# Patient Record
Sex: Female | Born: 1940 | Race: White | Hispanic: No | Marital: Single | Smoking: Never smoker
Health system: Southern US, Community
[De-identification: ages and names within clinical notes are randomized; demographics above are authoritative.]

## PROBLEM LIST (undated history)

## (undated) DIAGNOSIS — Z8619 Personal history of other infectious and parasitic diseases: Secondary | ICD-10-CM

## (undated) DIAGNOSIS — M5030 Other cervical disc degeneration, unspecified cervical region: Secondary | ICD-10-CM

## (undated) DIAGNOSIS — Z860100 Personal history of colon polyps, unspecified: Secondary | ICD-10-CM

## (undated) DIAGNOSIS — J309 Allergic rhinitis, unspecified: Secondary | ICD-10-CM

## (undated) DIAGNOSIS — G459 Transient cerebral ischemic attack, unspecified: Secondary | ICD-10-CM

## (undated) DIAGNOSIS — I639 Cerebral infarction, unspecified: Secondary | ICD-10-CM

## (undated) HISTORY — PX: TUBAL LIGATION: SHX77

## (undated) HISTORY — PX: APPENDECTOMY: SHX54

## (undated) HISTORY — PX: EYE SURGERY: SHX253

---

## 2003-10-11 ENCOUNTER — Ambulatory Visit: Payer: Self-pay | Admitting: Unknown Physician Specialty

## 2004-09-09 ENCOUNTER — Ambulatory Visit: Payer: Self-pay | Admitting: Obstetrics and Gynecology

## 2005-09-28 ENCOUNTER — Ambulatory Visit: Payer: Self-pay | Admitting: Obstetrics and Gynecology

## 2006-10-04 ENCOUNTER — Ambulatory Visit: Payer: Self-pay | Admitting: Obstetrics and Gynecology

## 2007-10-06 ENCOUNTER — Ambulatory Visit: Payer: Self-pay | Admitting: Obstetrics and Gynecology

## 2008-04-16 ENCOUNTER — Emergency Department: Payer: Self-pay | Admitting: Emergency Medicine

## 2008-04-22 ENCOUNTER — Emergency Department: Payer: Self-pay | Admitting: Emergency Medicine

## 2008-10-11 ENCOUNTER — Ambulatory Visit: Payer: Self-pay | Admitting: Obstetrics and Gynecology

## 2009-10-28 ENCOUNTER — Ambulatory Visit: Payer: Self-pay | Admitting: Obstetrics and Gynecology

## 2010-12-17 ENCOUNTER — Ambulatory Visit: Payer: Self-pay | Admitting: Obstetrics and Gynecology

## 2011-12-21 ENCOUNTER — Ambulatory Visit: Payer: Self-pay | Admitting: Obstetrics and Gynecology

## 2012-08-15 ENCOUNTER — Ambulatory Visit: Payer: Self-pay | Admitting: Ophthalmology

## 2012-12-21 ENCOUNTER — Ambulatory Visit: Payer: Self-pay | Admitting: Obstetrics and Gynecology

## 2015-07-16 ENCOUNTER — Encounter: Payer: Self-pay | Admitting: Emergency Medicine

## 2015-07-16 ENCOUNTER — Observation Stay: Payer: Medicare Other

## 2015-07-16 ENCOUNTER — Observation Stay
Admission: EM | Admit: 2015-07-16 | Discharge: 2015-07-17 | Disposition: A | Discharge status: Home / Self Care | Payer: BLUE CROSS/BLUE SHIELD | Attending: Internal Medicine | Admitting: Internal Medicine

## 2015-07-16 ENCOUNTER — Emergency Department: Payer: BLUE CROSS/BLUE SHIELD

## 2015-07-16 ENCOUNTER — Observation Stay: Payer: BLUE CROSS/BLUE SHIELD

## 2015-07-16 ENCOUNTER — Inpatient Hospital Stay
Admit: 2015-07-16 | Discharge: 2015-07-16 | Disposition: A | Discharge status: Home / Self Care | Payer: BLUE CROSS/BLUE SHIELD | Attending: Internal Medicine | Admitting: Internal Medicine

## 2015-07-16 ENCOUNTER — Ambulatory Visit (INDEPENDENT_AMBULATORY_CARE_PROVIDER_SITE_OTHER)
Admission: EM | Admit: 2015-07-16 | Discharge: 2015-07-16 | Disposition: A | Discharge status: Other Acute Inpatient Hospital | Payer: BLUE CROSS/BLUE SHIELD | Source: Home / Self Care | Attending: Family Medicine | Admitting: Family Medicine

## 2015-07-16 DIAGNOSIS — Z79899 Other long term (current) drug therapy: Secondary | ICD-10-CM | POA: Diagnosis not present

## 2015-07-16 DIAGNOSIS — I10 Essential (primary) hypertension: Secondary | ICD-10-CM | POA: Diagnosis not present

## 2015-07-16 DIAGNOSIS — J309 Allergic rhinitis, unspecified: Secondary | ICD-10-CM | POA: Diagnosis not present

## 2015-07-16 DIAGNOSIS — M4802 Spinal stenosis, cervical region: Secondary | ICD-10-CM

## 2015-07-16 DIAGNOSIS — G459 Transient cerebral ischemic attack, unspecified: Secondary | ICD-10-CM

## 2015-07-16 DIAGNOSIS — R200 Anesthesia of skin: Secondary | ICD-10-CM | POA: Diagnosis present

## 2015-07-16 DIAGNOSIS — Z8249 Family history of ischemic heart disease and other diseases of the circulatory system: Secondary | ICD-10-CM | POA: Diagnosis not present

## 2015-07-16 DIAGNOSIS — E871 Hypo-osmolality and hyponatremia: Secondary | ICD-10-CM

## 2015-07-16 DIAGNOSIS — M50323 Other cervical disc degeneration at C6-C7 level: Secondary | ICD-10-CM | POA: Insufficient documentation

## 2015-07-16 DIAGNOSIS — D1809 Hemangioma of other sites: Secondary | ICD-10-CM | POA: Diagnosis not present

## 2015-07-16 DIAGNOSIS — Z800 Family history of malignant neoplasm of digestive organs: Secondary | ICD-10-CM | POA: Insufficient documentation

## 2015-07-16 DIAGNOSIS — I700 Atherosclerosis of aorta: Secondary | ICD-10-CM | POA: Diagnosis not present

## 2015-07-16 DIAGNOSIS — M5124 Other intervertebral disc displacement, thoracic region: Secondary | ICD-10-CM | POA: Insufficient documentation

## 2015-07-16 DIAGNOSIS — Z791 Long term (current) use of non-steroidal anti-inflammatories (NSAID): Secondary | ICD-10-CM | POA: Insufficient documentation

## 2015-07-16 DIAGNOSIS — R531 Weakness: Secondary | ICD-10-CM

## 2015-07-16 DIAGNOSIS — M5030 Other cervical disc degeneration, unspecified cervical region: Secondary | ICD-10-CM

## 2015-07-16 DIAGNOSIS — M2578 Osteophyte, vertebrae: Secondary | ICD-10-CM | POA: Diagnosis not present

## 2015-07-16 HISTORY — DX: Allergic rhinitis, unspecified: J30.9

## 2015-07-16 LAB — COMPREHENSIVE METABOLIC PANEL
ALBUMIN: 4.7 g/dL (ref 3.5–5.0)
ALT: 16 U/L (ref 14–54)
AST: 26 U/L (ref 15–41)
Alkaline Phosphatase: 84 U/L (ref 38–126)
Anion gap: 9 (ref 5–15)
BUN: 8 mg/dL (ref 6–20)
CHLORIDE: 102 mmol/L (ref 101–111)
CO2: 23 mmol/L (ref 22–32)
CREATININE: 0.84 mg/dL (ref 0.44–1.00)
Calcium: 9 mg/dL (ref 8.9–10.3)
GFR calc Af Amer: >60 mL/min (ref >60)
GLUCOSE: 106 mg/dL — AB (ref 65–99)
POTASSIUM: 3.9 mmol/L (ref 3.5–5.1)
Sodium: 134 mmol/L — ABNORMAL LOW (ref 135–145)
Total Bilirubin: 0.6 mg/dL (ref 0.3–1.2)
Total Protein: 7.5 g/dL (ref 6.5–8.1)

## 2015-07-16 LAB — URINALYSIS COMPLETE WITH MICROSCOPIC (ARMC ONLY)
BILIRUBIN URINE: NEGATIVE
Bacteria, UA: NONE SEEN
GLUCOSE, UA: NEGATIVE mg/dL
KETONES UR: NEGATIVE mg/dL
Leukocytes, UA: NEGATIVE
Nitrite: NEGATIVE
Protein, ur: NEGATIVE mg/dL
SPECIFIC GRAVITY, URINE: 1.004 — AB (ref 1.005–1.030)
WBC, UA: NONE SEEN WBC/hpf (ref 0–5)
pH: 7 (ref 5.0–8.0)

## 2015-07-16 LAB — APTT: APTT: 25 s (ref 24–36)

## 2015-07-16 LAB — CBC WITH DIFFERENTIAL/PLATELET
BASOS ABS: 0 10*3/uL (ref 0–0.1)
BASOS PCT: 1 %
EOS PCT: 0 %
Eosinophils Absolute: 0 10*3/uL (ref 0–0.7)
HCT: 37.4 % (ref 35.0–47.0)
Hemoglobin: 12.9 g/dL (ref 12.0–16.0)
LYMPHS PCT: 21 %
Lymphs Abs: 1.7 10*3/uL (ref 1.0–3.6)
MCH: 29.1 pg (ref 26.0–34.0)
MCHC: 34.5 g/dL (ref 32.0–36.0)
MCV: 84.4 fL (ref 80.0–100.0)
MONO ABS: 0.4 10*3/uL (ref 0.2–0.9)
Monocytes Relative: 5 %
NEUTROS ABS: 6.2 10*3/uL (ref 1.4–6.5)
Neutrophils Relative %: 73 %
PLATELETS: 302 10*3/uL (ref 150–440)
RBC: 4.44 MIL/uL (ref 3.80–5.20)
RDW: 13.4 % (ref 11.5–14.5)
WBC: 8.4 10*3/uL (ref 3.6–11.0)

## 2015-07-16 LAB — TROPONIN I

## 2015-07-16 LAB — PROTIME-INR
INR: 0.94
Prothrombin Time: 12.8 seconds (ref 11.4–15.0)

## 2015-07-16 LAB — GLUCOSE, CAPILLARY: GLUCOSE-CAPILLARY: 116 mg/dL — AB (ref 65–99)

## 2015-07-16 MED ORDER — ASPIRIN 81 MG PO CHEW
324.0000 mg | CHEWABLE_TABLET | Freq: Once | ORAL | Status: AC
  Administered 2015-07-16: 324 mg via ORAL
  Filled 2015-07-16: qty 4

## 2015-07-16 MED ORDER — ACETAMINOPHEN 325 MG PO TABS: 650.0000 mg | ORAL_TABLET | Freq: Four times a day (QID) | ORAL | Status: DC | PRN

## 2015-07-16 MED ORDER — ATORVASTATIN CALCIUM 20 MG PO TABS
40.0000 mg | ORAL_TABLET | Freq: Every day | ORAL | Status: DC
  Administered 2015-07-16: 40 mg via ORAL
  Filled 2015-07-16: qty 2

## 2015-07-16 MED ORDER — SODIUM CHLORIDE 0.9% FLUSH
3.0000 mL | Freq: Two times a day (BID) | INTRAVENOUS | Status: DC
  Administered 2015-07-16: 3 mL via INTRAVENOUS

## 2015-07-16 MED ORDER — ACETAMINOPHEN 650 MG RE SUPP: 650.0000 mg | Freq: Four times a day (QID) | RECTAL | Status: DC | PRN

## 2015-07-16 MED ORDER — LORATADINE 10 MG PO TABS
10.0000 mg | ORAL_TABLET | Freq: Every day | ORAL | Status: DC
  Administered 2015-07-17: 10 mg via ORAL
  Filled 2015-07-16: qty 1

## 2015-07-16 MED ORDER — ASPIRIN EC 81 MG PO TBEC
81.0000 mg | DELAYED_RELEASE_TABLET | Freq: Every day | ORAL | Status: DC
  Administered 2015-07-17: 81 mg via ORAL
  Filled 2015-07-16: qty 1

## 2015-07-16 MED ORDER — ENOXAPARIN SODIUM 40 MG/0.4ML ~~LOC~~ SOLN
40.0000 mg | SUBCUTANEOUS | Status: DC
  Administered 2015-07-16: 40 mg via SUBCUTANEOUS
  Filled 2015-07-16: qty 0.4

## 2015-07-16 NOTE — ED Notes (Signed)
Patient states that 7am this morning she started having left sided weakness. Patient states that the episodes come and go and she feels a heavyness on left side. Patient states that she has had some nausea and vision disturbance. Patient states that right now she feels okay.

## 2015-07-16 NOTE — ED Notes (Signed)
Pt to ED via EMS from [facility] c/o left leg numbness and weakness around 0715 this morning pt states resolved around 0800.  Pt states left knee buckled around 6-7 times and felt weak and heavy with tingling, tingling to left arm and corner or left mouth as well.  Pt denies any pain and SOB.  States mild nausea and no vomiting.  Denies hx of stroke or cardiac.  Pt is A&Ox4, speaking in complete and coherent sentences and in NAD at this time.

## 2015-07-16 NOTE — ED Provider Notes (Signed)
[facility] Emergency Department Provider Note   ____________________________________________  Time seen: Approximately 10:57 AM  I have reviewed the triage vital signs and the nursing notes.   HISTORY  Chief Complaint Weakness    HPI Wendy Decker is a 75 y.o. female with no chronic medical problems who presents for evaluation of left leg numbness and weakness this morning between the hours of approximately 7 AM and 9 AM, now resolved, moderate, no modifying factors. Patient reports that several times this morning her left leg buckled when she was trying to walk, she noted numbness and tingling in the left leg and also had some tingling in the left arm of her mouth. She was seen at urgent care and referred to emergency department for further evaluation per she has never had anything like this before. She does have a family history of CVAs and TIAs. Currently she reports her weakness has resolved but "I still don't feel right on the left side of my body". They difficulty breathing, no vomiting, she has had 2 episodes of nonbloody diarrhea today. No fevers or chills. No dysuria.   History reviewed. No pertinent past medical history.  There are no active problems to display for this patient.   Past Surgical History  Procedure Laterality Date  . Appendectomy      No current outpatient prescriptions on file.  Allergies Review of patient's allergies indicates no known allergies.  Family History  Problem Relation Age of Onset  . Pancreatic cancer Father     Social History Social History  Substance Use Topics  . Smoking status: Never Smoker   . Smokeless tobacco: None  . Alcohol Use: No    Review of Systems Constitutional: No fever/chills Eyes: No visual changes. ENT: No sore throat. Cardiovascular: Denies chest pain. Respiratory: Denies shortness of breath. Gastrointestinal: No abdominal pain.  No nausea, no vomiting.  No diarrhea.  No  constipation. Genitourinary: Negative for dysuria. Musculoskeletal: Negative for back pain. Skin: Negative for rash. Neurological: Negative for headaches, positive for focal weakness and numbness.  10-point ROS otherwise negative.  ____________________________________________   PHYSICAL EXAM:  Filed Vitals:   07/16/15 1057 07/16/15 1100  BP: 172/80 161/73  Pulse: 101   Temp: 98.5 F (36.9 C)   TempSrc: Oral   Resp: 14 12  Height: 5\' 1"  (1.549 m)   Weight: 115 lb (52.164 kg)   SpO2: 98%     Constitutional: Alert and oriented. Well appearing and in no acute distress. Eyes: Conjunctivae are normal. PERRL. EOMI. Head: Atraumatic. Nose: No congestion/rhinnorhea. Mouth/Throat: Mucous membranes are moist.  Oropharynx non-erythematous. Neck: No stridor.  Supple without meningismus. Cardiovascular: Normal rate, regular rhythm. Grossly normal heart sounds.  Good peripheral circulation. Respiratory: Normal respiratory effort.  No retractions. Lungs CTAB. Gastrointestinal: Soft and nontender. No distention. No CVA tenderness. Genitourinary: deferred Musculoskeletal: No lower extremity tenderness nor edema.  No joint effusions. Neurologic:  Normal speech and language. No gross focal neurologic deficits are appreciated. No gait instability. 5 out of 5 strength in bilateral upper and lower extremities, sensation intact to light touch throughout, cranial nerves II through XII intact. Skin:  Skin is warm, dry and intact. No rash noted. Psychiatric: Mood and affect are normal. Speech and behavior are normal.  ____________________________________________   LABS (all labs ordered are listed, but only abnormal results are displayed)  Labs Reviewed  COMPREHENSIVE METABOLIC PANEL - Abnormal; Notable for the following:    Sodium 134 (*)    Glucose, Bld  106 (*)    All other components within normal limits  URINALYSIS COMPLETEWITH MICROSCOPIC ([facility] ONLY) - Abnormal; Notable for the  following:    Color, Urine COLORLESS (*)    APPearance CLEAR (*)    Specific Gravity, Urine 1.004 (*)    Hgb urine dipstick 1+ (*)    Squamous Epithelial / LPF 0-5 (*)    All other components within normal limits  CBC WITH DIFFERENTIAL/PLATELET  TROPONIN I  PROTIME-INR  APTT   ____________________________________________  EKG  ED ECG REPORT I, Joanne Gavel, the attending physician, personally viewed and interpreted this ECG.   Date: 07/16/2015  EKG Time: 10:55  Rate: 94  Rhythm: normal sinus rhythm  Axis: normal  Intervals:none  ST&T Change: No acute ST elevation or acute ST depression.  ____________________________________________  RADIOLOGY  CT head IMPRESSION: Age related volume loss with patchy periventricular small vessel disease. No acute infarct evident. No hemorrhage or mass effect.  There are foci of carotid artery calcification. The left lens appears somewhat irregular by CT.   CXR IMPRESSION: Aortic atherosclerosis. No edema or consolidation.   ____________________________________________   PROCEDURES  Procedure(s) performed: None  Procedures  Critical Care performed: No  ____________________________________________   INITIAL IMPRESSION / ASSESSMENT AND PLAN / ED COURSE  Pertinent labs & imaging results that were available during my care of the patient were reviewed by me and considered in my medical decision making (see chart for details).  Wendy Decker is a 75 y.o. female with no chronic medical problems who presents for evaluation of left leg numbness and weakness this morning between the hours of approximately 7 AM and 9 AM, now resolved, moderate, no modifying factors. On exam, she is well-appearing in no acute distress. Her vital signs are stable and she is afebrile. She has an intact neurological examination at this time. Current and NIH stroke scale is 0, not a candidate for tPa. Concern for TIA. CBC CMP unremarkable, negative  troponin and coags are within normal limits. Urinalysis is not consistent with infection. CT head shows no acute intracranial abnormality. Chest x-ray shows atherosclerosis of the aorta but no acute cardiopulmonary abnormality. I have ordered aspirin. Case discussed with hospitalist for admission at 12:25 PM. ____________________________________________   FINAL CLINICAL IMPRESSION(S) / ED DIAGNOSES  Final diagnoses:  Transient cerebral ischemia, unspecified transient cerebral ischemia type      NEW MEDICATIONS STARTED DURING THIS VISIT:  New Prescriptions   No medications on file     Note:  This document was prepared using Dragon voice recognition software and may include unintentional dictation errors.    Joanne Gavel, MD 07/16/15 1226

## 2015-07-16 NOTE — ED Provider Notes (Signed)
[facility]  ____________________________________________  Time seen: Approximately 10:07 AM  I have reviewed the triage vital signs and the nursing notes.   HISTORY  Chief Complaint Weakness  HPI Wendy Decker is a 75 y.o. femalepresents via private vehicle with husband for the complaints of left-sided weakness. Patient reports at 7 AM this morning her left leg suddenly felt weak as if it were going to buckle and give out. Patient denies fall. Patient reports she then had onset of left arm and left hand weakness. Patient states that when she held her cell phone, it felt like the cell phone was extremely heavy. Patient patient reports she then gradually had onset of left neck and left lateral mouth tingling and decreased sensation. Accompanying this she also reports slight blurry vision. Patient reports that she is due for cataract surgery but states that this was different than what her baseline is. Patient reports that she had not yet eaten this morning.  Patient states that currently she is feeling better but still has continued "left side of body not completely normal" and left neck was still decreased sensation. Denies headache. Denies chest pain, shortness of breath, abdominal pain, or pain. Denies recent sickness, recent fevers, recent hospitalizations. Denies any history of same in the past. Denies any recent fall or head trauma. Patient reports that when she felt like her left leg was going to buckle and give out it was not from a pain or feeling like the knee or hip was going to buckle but states that overall weakness.  Patient does also report that she has had 2 episodes of diarrhea this morning as well as her symptoms are worse when actively moving. Patient denies dizziness or room spinning. Reports she does have a history of vertigo years ago and states that this is different.   Patient denies any medical history for herself. Denies any prescription medications. Patient  reports  her mother had TIAs as well as her sister had a CVA with residual deficits. Denies history of hypertension.   History reviewed. No pertinent past medical history.  There are no active problems to display for this patient.   Past Surgical History  Procedure Laterality Date  . Appendectomy      No current outpatient prescriptions on file.  Allergies Review of patient's allergies indicates no known allergies.  Family History  Problem Relation Age of Onset  . Pancreatic cancer Father     Social History Social History  Substance Use Topics  . Smoking status: Never Smoker   . Smokeless tobacco: None  . Alcohol Use: No    Review of Systems Constitutional: No fever/chills Eyes: No visual changes. ENT: No sore throat. Cardiovascular: Denies chest pain. Respiratory: Denies shortness of breath. Gastrointestinal: No abdominal pain.  No nausea, no vomiting.  No diarrhea.  No constipation. Genitourinary: Negative for dysuria. Musculoskeletal: Negative for back pain. Skin: Negative for rash. Neurological: Negative for headaches.  10-point ROS otherwise negative.  ____________________________________________   PHYSICAL EXAM:  VITAL SIGNS: ED Triage Vitals  Enc Vitals Group     BP 07/16/15 0935 177/70 mmHg     Pulse Rate 07/16/15 0935 75     Resp 07/16/15 0935 17     Temp 07/16/15 0935 98 F (36.7 C)     Temp Source 07/16/15 0935 Oral     SpO2 07/16/15 0935 100 %     Weight 07/16/15 0935 115 lb (52.164 kg)     Height 07/16/15 0935 5\' 1"  (1.549 m)  Head Cir --      Peak Flow --      Pain Score 07/16/15 0937 0     Pain Loc --      Pain Edu? --      Excl. in [place]? --    Constitutional: Alert and oriented. Well appearing and in no acute distress. Eyes: Conjunctivae are normal. PERRL. EOMI. Head: Atraumatic.  Ears: no erythema, normal TMs bilaterally.   Nose: No congestion/rhinnorhea.  Mouth/Throat: Mucous membranes are moist.  Oropharynx  non-erythematous. Neck: No stridor.  No cervical spine tenderness to palpation. Hematological/Lymphatic/Immunilogical: No cervical lymphadenopathy. Cardiovascular: Normal rate, regular rhythm. Grossly normal heart sounds.  Good peripheral circulation. Respiratory: Normal respiratory effort.  No retractions. Lungs CTAB. No wheezes, rales or rhonchi.  Gastrointestinal: Soft and nontender. No distention. Normal Bowel sounds. No CVA tenderness. Musculoskeletal: No lower or upper extremity tenderness nor edema. Bilateral pedal pulses equal and easily palpated.  Neurologic:  Normal speech and language.  Steady gait. Negative pronators drift. Bilateral hand grips strong and equal. No ataxia. Normal finger to nose. Left upper neck slight decreased sensation compared to right. Otherwise sensation equal bilaterally to bilateral upper and lower extremities. Skin:  Skin is warm, dry and intact. No rash noted. Psychiatric: Mood and affect are normal. Speech and behavior are normal.   ED ECG REPORT I, Marylene Land, the attending provider, personally viewed and interpreted this ECG.  Date: 07/16/2015 EKG Time: 0951 Rate: 94 Rhythm: normal sinus rhythm QRS Axis: normal Intervals: normal ST/T Wave abnormalities: normal Conduction Disturbances: none   ____________________________________________   LABS (all labs ordered are listed, but only abnormal results are displayed)  Labs Reviewed  GLUCOSE, CAPILLARY - Abnormal; Notable for the following:    Glucose-Capillary 116 (*)    All other components within normal limits  CBG MONITORING, ED    INITIAL IMPRESSION / ASSESSMENT AND PLAN / ED COURSE  Pertinent labs & imaging results that were available during my care of the patient were reviewed by me and considered in my medical decision making (see chart for details).  Overall well-appearing patient. Presenting today for the complaints of acute onset of left-sided complaints. Discussed in detail  with patient, concerned patient having a TIA. Discussed in detail with patient, husband and niece at bedside recommend patient be evaluated emergency room at this time. Recommend EMS transfer.  Charge nurse Presenter, broadcasting at [employer] called and report given to. Patient stable at time of transfer by EMS.  Discussed follow up with Primary care physician this week. Discussed follow up and return parameters including no resolution or any worsening concerns. Patient verbalized understanding and agreed to plan.   ____________________________________________   FINAL CLINICAL IMPRESSION(S) / ED DIAGNOSES  Final diagnoses:  Transient cerebral ischemia, unspecified transient cerebral ischemia type     There are no discharge medications for this patient.   Note: This dictation was prepared with Dragon dictation along with smaller phrase technology. Any transcriptional errors that result from this process are unintentional.       Marylene Land, NP 07/16/15 1027

## 2015-07-16 NOTE — Progress Notes (Signed)
Physical Therapy Evaluation Patient Details Name: Wendy Decker MRN: [id number] DOB: [birth year] Today's Date: 07/16/2015   History of Present Illness  Pt was in shower this morning when L LE was giving way and heaviness felt in L UE. Pt admitted with L-sided numbness with possible TIA.  Clinical Impression  Pt is a pleasant 75 y/o female who presents with possible TIA. Pt reports she had L-sided weakness this AM with instances of L LE giving way, but feels stronger now. Sensation intact bilaterally. B grip strength symmetrical. B UE/LE strength grossly WNL. Negative RAMPS. L side finger to nose slightly decreased speed vs. R side. Pt is independent for all bed mobility, transfers, and ambulation ~172ft. Pt demonstrated a step-through gait pattern with no decrease in gait speed when asked to look up, down, and to the R during ambulation. No unsteadiness/LOB noted when asked to increase gait speed quickly and stop abruptly. Pt demonstrated difficulty with bringing L LE in front of R LE during tandem gait. Pt decreased gait speed and demonstrated drift to L when asked to turn head to L with ambulation. Pt reports this is her baseline. Pt appears to be at baseline function, PT discussed with patient possibility of outpatient PT if balance issues get worse in future but pt does not require any PT services at this time.     Follow Up Recommendations No PT follow up    Equipment Recommendations       Recommendations for Other Services       Precautions / Restrictions Restrictions Weight Bearing Restrictions: No      Mobility  Bed Mobility Overal bed mobility: Independent             General bed mobility comments: Pt was able to perform bed mobility with safe technique.  Transfers Overall transfer level: Independent Equipment used: None             General transfer comment: Pt demonstrated safe technique with sit/stand transfers.  Ambulation/Gait Ambulation/Gait assistance:  Independent Ambulation Distance (Feet): 180 Feet Assistive device: None Gait Pattern/deviations: WFL(Within Functional Limits);Step-through pattern   Gait velocity interpretation: at or above normal speed for age/gender General Gait Details: Pt demonstrated step-through gait pattern. Pt required min guard for tandem gait and was unsteady to begin but was able to regain her balance. Pt slowed down and drifted to the L when asked to turn head to L during ambulation.   Stairs            Wheelchair Mobility    Modified Rankin (Stroke Patients Only)       Balance Overall balance assessment: Independent                                           Pertinent Vitals/Pain Pain Assessment: No/denies pain    Home Living Family/patient expects to be discharged to:: Private residence Living Arrangements: Spouse/significant other Available Help at Discharge: Family                  Prior Function Level of Independence: Independent               Hand Dominance   Dominant Hand: Right    Extremity/Trunk Assessment   Upper Extremity Assessment: Overall WFL for tasks assessed           Lower Extremity Assessment: Overall WFL for tasks assessed (Hip flexors: R-5/5  L-4/5. B knee flex/ext: 5/5)         Communication   Communication: No difficulties  Cognition Arousal/Alertness: Awake/alert Behavior During Therapy: WFL for tasks assessed/performed Overall Cognitive Status: Within Functional Limits for tasks assessed                      General Comments      Exercises        Assessment/Plan    PT Assessment Patent does not need any further PT services  PT Diagnosis Difficulty walking   PT Problem List    PT Treatment Interventions     PT Goals (Current goals can be found in the Care Plan section) Acute Rehab PT Goals Patient Stated Goal: To return home PT Goal Formulation: With patient Time For Goal Achievement:  07/30/15 Potential to Achieve Goals: Good    Frequency     Barriers to discharge        Co-evaluation               End of Session Equipment Utilized During Treatment: Gait belt Activity Tolerance: Patient tolerated treatment well Patient left: in bed;with call bell/phone within reach;with family/visitor present (Per RN staff, no bed alarm needed. ) Nurse Communication: Mobility status         Time: [id number] PT Time Calculation (min) (ACUTE ONLY): 14 min   Charges:         PT G Codes:        Georgina Pillion 07-28-2015, 5:29 PM  Georgina Pillion, SPT [phone]

## 2015-07-16 NOTE — H&P (Signed)
[facility] [place] NAME: Wendy Decker    MR#:  [id number]  DATE OF BIRTH:  07/25/1940  DATE OF ADMISSION:  07/16/2015  PRIMARY CARE PHYSICIAN: No PCP Per Patient   REQUESTING/REFERRING PHYSICIAN: Dr Girard Cooter  CHIEF COMPLAINT:   Chief Complaint  Patient presents with  . Weakness    HISTORY OF PRESENT ILLNESS:  Wendy Decker  is a 75 y.o. female with No past medical history. She presents to the ER after she took her shower her left leg was giving out. She needed help back to the bathroom. 3-5 times her left leg gave out and she had to slide down to the floor. She described her left leg as bucking. She did have one episode of diarrhea. She did have a visual issue and some nausea this morning but those symptoms have gone away. She also felt her left arm was weak and the phone was very heavy. Her left neck has been sore this a.m. and yesterday her left shoulder and arm were bothering her.  PAST MEDICAL HISTORY:   Past Medical History  Diagnosis Date  . Allergic rhinitis     PAST SURGICAL HISTORY:   Past Surgical History  Procedure Laterality Date  . Appendectomy      SOCIAL HISTORY:   Social History  Substance Use Topics  . Smoking status: Never Smoker   . Smokeless tobacco: Not on file  . Alcohol Use: No    FAMILY HISTORY:   Family History  Problem Relation Age of Onset  . Pancreatic cancer Father   . CAD Father   . CAD Mother     DRUG ALLERGIES:  No Known Allergies  REVIEW OF SYSTEMS:  CONSTITUTIONAL: No fever. Positive for left-sided weakness.  EYES: Patient had visual issue this morning. She wears glasses. Her eyes run EARS, NOSE, AND THROAT: No tinnitus or ear pain. No sore throat. Positive for runny nose RESPIRATORY: No cough, shortness of breath, wheezing or hemoptysis.  CARDIOVASCULAR: No chest pain, orthopnea, edema.  GASTROINTESTINAL: Some brief nausea this morning. No vomiting, or abdominal pain.  No blood in bowel movements. Episode of diarrhea today GENITOURINARY: No dysuria, hematuria.  ENDOCRINE: No polyuria, nocturia,  HEMATOLOGY: No anemia, easy bruising or bleeding SKIN: No rash or lesion. MUSCULOSKELETAL: Left neck pain left shoulder pain.   NEUROLOGIC: No tingling, numbness, weakness.  PSYCHIATRY: No anxiety or depression.   MEDICATIONS AT HOME:   Prior to Admission medications   Medication Sig Start Date End Date Taking? Authorizing Provider  cetirizine (ZYRTEC) 10 MG tablet Take 10 mg by mouth daily as needed for allergies.   Yes Historical Provider, MD  naproxen sodium (ANAPROX) 220 MG tablet Take 220 mg by mouth 2 (two) times daily as needed (for pain).   Yes Historical Provider, MD      VITAL SIGNS:  Blood pressure 156/85, pulse 101, temperature 98.5 F (36.9 C), temperature source Oral, resp. rate 16, height 5\' 1"  (1.549 m), weight 52.164 kg (115 lb), SpO2 100 %.  PHYSICAL EXAMINATION:  GENERAL:  75 y.o.-year-old patient lying in the bed with no acute distress.  EYES: Pupils equal, round, reactive to light and accommodation. No scleral icterus. Extraocular muscles intact.  HEENT: Head atraumatic, normocephalic. Oropharynx and nasopharynx clear.  NECK:  Supple, no jugular venous distention. No thyroid enlargement, no tenderness.  LUNGS: Normal breath sounds bilaterally, no wheezing, rales,rhonchi or crepitation. No use of accessory muscles of respiration.  CARDIOVASCULAR: S1, S2 normal.  No murmurs, rubs, or gallops.  ABDOMEN: Soft, nontender, nondistended. Bowel sounds present. No organomegaly or mass.  EXTREMITIES: No pedal edema, cyanosis, or clubbing. Good range of motion neck and shoulders. NEUROLOGIC: Cranial nerves II through XII are intact. Muscle strength 5/5 in all extremities. Sensation intact. Patient was cautious with walking in the room. Finger-nose intact bilaterally. Rapid finger movements intact bilaterally PSYCHIATRIC: The patient is alert and  oriented x 3.  SKIN: No rash, lesion, or ulcer.   LABORATORY PANEL:   CBC  Recent Labs Lab 07/16/15 1129  WBC 8.4  HGB 12.9  HCT 37.4  PLT 302   ------------------------------------------------------------------------------------------------------------------  Chemistries   Recent Labs Lab 07/16/15 1129  NA 134*  K 3.9  CL 102  CO2 23  GLUCOSE 106*  BUN 8  CREATININE 0.84  CALCIUM 9.0  AST 26  ALT 16  ALKPHOS 84  BILITOT 0.6   ------------------------------------------------------------------------------------------------------------------  Cardiac Enzymes  Recent Labs Lab 07/16/15 1129  TROPONINI <0.03   ------------------------------------------------------------------------------------------------------------------  RADIOLOGY:  Ct Head Wo Contrast  07/16/2015  CLINICAL DATA:  Left-sided weakness for 1 day ; mild blurred vision EXAM: CT HEAD WITHOUT CONTRAST TECHNIQUE: Contiguous axial images were obtained from the base of the skull through the vertex without intravenous contrast. COMPARISON:  None. FINDINGS: Brain: There is age related volume loss. There is no intracranial mass, hemorrhage, extra-axial fluid collection, or midline shift. There is patchy small vessel disease throughout the centra semiovale bilaterally. Elsewhere gray-white compartments appear unremarkable. No acute infarct evident. Vascular: There is calcification in the cavernous carotid artery regions bilaterally, more on the left than on the right. There also foci of calcification in the distal left vertebral artery. No hyperdense vessels are evident. Skull: The bony calvarium appears intact. Sinuses/Orbits: The visualized paranasal sinuses appear normal. The lens of the left globe appears somewhat irregular in contour. Orbits otherwise appear normal bilaterally. Other: Mastoid air cells are clear. IMPRESSION: Age related volume loss with patchy periventricular small vessel disease. No acute  infarct evident. No hemorrhage or mass effect. There are foci of carotid artery calcification. The left lens appears somewhat irregular by CT. Electronically Signed   By: Lowella Grip III M.D.   On: 07/16/2015 11:30   Dg Chest Portable 1 View  07/16/2015  CLINICAL DATA:  Left arm numbness and weakness. EXAM: PORTABLE CHEST 1 VIEW COMPARISON:  None. FINDINGS: Lungs are clear. Heart size and pulmonary vascularity are normal. No adenopathy. There is atherosclerotic calcification in the aortic arch. No adenopathy. No bone lesions. IMPRESSION: Aortic atherosclerosis.  No edema or consolidation. Electronically Signed   By: Lowella Grip III M.D.   On: 07/16/2015 11:23    EKG:   Normal sinus rhythm 94 bpm  IMPRESSION AND PLAN:   1. Left-sided numbness and weakness. Patient still has a little weakness at this point. Will bring in as an observation and see if this is a stroke. MRI of the brain to rule out stroke. Since the patient is also having neck pain will MRI of the cervical spine. Obtain echocardiogram, carotid ultrasound and monitor on telemetry. Continue aspirin on a daily basis and add statin. Check a lipid profile in the a.m. Get physical therapy and occupational therapy consultations. 2. Elevated blood pressure without history of hypertension. Allow permissive hypertension at this point. 3. Allergic rhinitis. Take Zyrtec at home will give Claritin while here   All the records are reviewed and case discussed with ED provider. Management plans discussed with the patient, family and  they are in agreement.  CODE STATUS: Full code  TOTAL TIME TAKING CARE OF THIS PATIENT: 50 minutes.    Loletha Grayer M.D on 07/16/2015 at 12:56 PM  Between 7am to 6pm - Pager - [phone]  After 6pm call admission pager [phone]  [facility] Office  [phone]  CC: Primary care physician; No PCP Per Patient

## 2015-07-17 ENCOUNTER — Observation Stay: Payer: BLUE CROSS/BLUE SHIELD

## 2015-07-17 DIAGNOSIS — G459 Transient cerebral ischemic attack, unspecified: Secondary | ICD-10-CM | POA: Diagnosis not present

## 2015-07-17 DIAGNOSIS — M5030 Other cervical disc degeneration, unspecified cervical region: Secondary | ICD-10-CM | POA: Insufficient documentation

## 2015-07-17 DIAGNOSIS — E871 Hypo-osmolality and hyponatremia: Secondary | ICD-10-CM

## 2015-07-17 DIAGNOSIS — M4802 Spinal stenosis, cervical region: Secondary | ICD-10-CM | POA: Insufficient documentation

## 2015-07-17 DIAGNOSIS — R531 Weakness: Secondary | ICD-10-CM

## 2015-07-17 LAB — CBC
HEMATOCRIT: 34.9 % — AB (ref 35.0–47.0)
Hemoglobin: 12.2 g/dL (ref 12.0–16.0)
MCH: 29.1 pg (ref 26.0–34.0)
MCHC: 34.9 g/dL (ref 32.0–36.0)
MCV: 83.3 fL (ref 80.0–100.0)
Platelets: 263 10*3/uL (ref 150–440)
RBC: 4.19 MIL/uL (ref 3.80–5.20)
RDW: 13.5 % (ref 11.5–14.5)
WBC: 5.2 10*3/uL (ref 3.6–11.0)

## 2015-07-17 LAB — LIPID PANEL
Cholesterol: 143 mg/dL (ref 0–200)
HDL: 44 mg/dL (ref >40)
LDL CALC: 85 mg/dL (ref 0–99)
TRIGLYCERIDES: 69 mg/dL (ref <150)
Total CHOL/HDL Ratio: 3.3 RATIO
VLDL: 14 mg/dL (ref 0–40)

## 2015-07-17 LAB — BASIC METABOLIC PANEL
Anion gap: 6 (ref 5–15)
BUN: 11 mg/dL (ref 6–20)
CO2: 23 mmol/L (ref 22–32)
Calcium: 8.6 mg/dL — ABNORMAL LOW (ref 8.9–10.3)
Chloride: 105 mmol/L (ref 101–111)
Creatinine, Ser: 0.75 mg/dL (ref 0.44–1.00)
GFR calc Af Amer: >60 mL/min (ref >60)
GLUCOSE: 91 mg/dL (ref 65–99)
POTASSIUM: 3.9 mmol/L (ref 3.5–5.1)
Sodium: 134 mmol/L — ABNORMAL LOW (ref 135–145)

## 2015-07-17 LAB — ECHOCARDIOGRAM COMPLETE
Height: 61 in
Weight: 1840 oz

## 2015-07-17 MED ORDER — ASPIRIN EC 325 MG PO TBEC: 325.0000 mg | DELAYED_RELEASE_TABLET | Freq: Every day | ORAL | Status: DC

## 2015-07-17 MED ORDER — ATORVASTATIN CALCIUM 40 MG PO TABS: 40.0000 mg | ORAL_TABLET | Freq: Every day | ORAL | Status: DC

## 2015-07-17 NOTE — Discharge Summary (Signed)
[facility] [place] NAME: Wendy Decker    MR#:  [id number]  DATE OF BIRTH:  12-10-1940  DATE OF ADMISSION:  07/16/2015 ADMITTING PHYSICIAN: No admitting provider for patient encounter.  DATE OF DISCHARGE: 07/16/2015 11:59 PM  PRIMARY CARE PHYSICIAN: No PCP Per Patient     ADMISSION DIAGNOSIS:  There are no admission diagnoses documented for this encounter.  DISCHARGE DIAGNOSIS:  Active Problems:   * No active hospital problems. *   SECONDARY DIAGNOSIS:   Past Medical History  Diagnosis Date  . Allergic rhinitis     .pro HOSPITAL COURSE:   Patient is a 75 year old female with past medical history significant for history of allergic rhinitis who presents to the hospital with complaints of left lower extremity numbness, weakness, extending to left facial numbness, lasting 60-90 minutes and resolving spontaneously. On arrival to the hospital. Patient's CT scan of the head which was unremarkable. Labs revealed hyponatremia of 134, otherwise unremarkable studies. Patient was admitted to the hospital with diagnosis of TIA read. She underwent MRI of the brain and cervical spine, revealing cervical spine degenerative disc disease, mild spinal stenosis, foraminal stenosis, without abnormal spinal cord signal, brain MRI was unremarkable. Patient was seen by neurologist, who felt that patient had a TAH, recommended aspirin, Lipitor, patient was educated about these medications. Patient underwent echocardiogram, unremarkable, normal ejection fraction, no significant valvular disease, double ultrasound revealed mild amount of plaque, left greater than  right, no hemodynamically significant stenosis. Patient was felt to be stable to be discharged home Discussion by problem: #1., TIA, Patient is to continue aspirin, Lipitor, she is was educated about medications, adverse effects, complications. All questions were answered. Brain MRI showed no stroke,  echocardiogram as well as carotid ultrasound were unremarkable #2. Left sided weakness, resolved, TIA related #3. Hyponatremia, stable, etiology is unclear, follow-up as outpatient #4. Cervical degenerative disc disease, patient is to continue nonsteroidal anti-inflammatory medications as needed, no abnormal spinal cord signal, no compression signs  DISCHARGE CONDITIONS:   Stable  CONSULTS OBTAINED:     DRUG ALLERGIES:  No Known Allergies  DISCHARGE MEDICATIONS:  Cannot display discharge medications since this is not an admission.    DISCHARGE INSTRUCTIONS:    Patient is to follow-up with primary care physician as outpatient  If you experience worsening of your admission symptoms, develop shortness of breath, life threatening emergency, suicidal or homicidal thoughts you must seek medical attention immediately by calling 911 or calling your MD immediately  if symptoms less severe.  You Must read complete instructions/literature along with all the possible adverse reactions/side effects for all the Medicines you take and that have been prescribed to you. Take any new Medicines after you have completely understood and accept all the possible adverse reactions/side effects.   Please note  You were cared for by a hospitalist during your hospital stay. If you have any questions about your discharge medications or the care you received while you were in the hospital after you are discharged, you can call the unit and asked to speak with the hospitalist on call if the hospitalist that took care of you is not available. Once you are discharged, your primary care physician will handle any further medical issues. Please note that NO REFILLS for any discharge medications will be authorized once you are discharged, as it is imperative that you return to your primary care physician (or establish a relationship with a primary care physician if you do not  have one) for your aftercare needs so that  they can reassess your need for medications and monitor your lab values.    Today   CHIEF COMPLAINT:  No chief complaint on file.   HISTORY OF PRESENT ILLNESS:  Wendy Decker  is a 75 y.o. female with a known history of allergic rhinitis who presents to the hospital with complaints of left lower extremity numbness, weakness, extending to left facial numbness, lasting 60-90 minutes and resolving spontaneously. On arrival to the hospital. Patient's CT scan of the head which was unremarkable. Labs revealed hyponatremia of 134, otherwise unremarkable studies. Patient was admitted to the hospital with diagnosis of TIA read. She underwent MRI of the brain and cervical spine, revealing cervical spine degenerative disc disease, mild spinal stenosis, foraminal stenosis, without abnormal spinal cord signal, brain MRI was unremarkable. Patient was seen by neurologist, who felt that patient had a TAH, recommended aspirin, Lipitor, patient was educated about these medications. Patient underwent echocardiogram, unremarkable, normal ejection fraction, no significant valvular disease, double ultrasound revealed mild amount of plaque, left greater than  right, no hemodynamically significant stenosis. Patient was felt to be stable to be discharged home Discussion by problem: #1., TIA, Patient is to continue aspirin, Lipitor, she is was educated about medications, adverse effects, complications. All questions were answered. Brain MRI showed no stroke, echocardiogram as well as carotid ultrasound were unremarkable #2. Left sided weakness, resolved, TIA related #3. Hyponatremia, stable, etiology is unclear, follow-up as outpatient #4. Cervical degenerative disc disease, patient is to continue nonsteroidal anti-inflammatory medications as needed, no abnormal spinal cord signal, no compression signs    VITAL SIGNS:  There were no vitals taken for this visit.  I/O:   Intake/Output Summary (Last 24 hours) at 07/17/15  1415 Last data filed at 07/17/15 1339  Gross per 24 hour  Intake    480 ml  Output      0 ml  Net    480 ml    PHYSICAL EXAMINATION:  GENERAL:  75 y.o.-year-old patient lying in the bed with no acute distress.  EYES: Pupils equal, round, reactive to light and accommodation. No scleral icterus. Extraocular muscles intact.  HEENT: Head atraumatic, normocephalic. Oropharynx and nasopharynx clear.  NECK:  Supple, no jugular venous distention. No thyroid enlargement, no tenderness.  LUNGS: Normal breath sounds bilaterally, no wheezing, rales,rhonchi or crepitation. No use of accessory muscles of respiration.  CARDIOVASCULAR: S1, S2 normal. No murmurs, rubs, or gallops.  ABDOMEN: Soft, non-tender, non-distended. Bowel sounds present. No organomegaly or mass.  EXTREMITIES: No pedal edema, cyanosis, or clubbing.  NEUROLOGIC: Cranial nerves II through XII are intact. Muscle strength 5/5 in all extremities. Sensation intact. Gait not checked.  PSYCHIATRIC: The patient is alert and oriented x 3.  SKIN: No obvious rash, lesion, or ulcer.   DATA REVIEW:   CBC  Recent Labs Lab 07/17/15 0626  WBC 5.2  HGB 12.2  HCT 34.9*  PLT 263    Chemistries   Recent Labs Lab 07/16/15 1129 07/17/15 0626  NA 134* 134*  K 3.9 3.9  CL 102 105  CO2 23 23  GLUCOSE 106* 91  BUN 8 11  CREATININE 0.84 0.75  CALCIUM 9.0 8.6*  AST 26  --   ALT 16  --   ALKPHOS 84  --   BILITOT 0.6  --     Cardiac Enzymes  Recent Labs Lab 07/16/15 1129  TROPONINI <0.03    Microbiology Results  No results found for this or  any previous visit.  RADIOLOGY:  Ct Head Wo Contrast  07/16/2015  CLINICAL DATA:  Left-sided weakness for 1 day ; mild blurred vision EXAM: CT HEAD WITHOUT CONTRAST TECHNIQUE: Contiguous axial images were obtained from the base of the skull through the vertex without intravenous contrast. COMPARISON:  None. FINDINGS: Brain: There is age related volume loss. There is no intracranial mass,  hemorrhage, extra-axial fluid collection, or midline shift. There is patchy small vessel disease throughout the centra semiovale bilaterally. Elsewhere gray-white compartments appear unremarkable. No acute infarct evident. Vascular: There is calcification in the cavernous carotid artery regions bilaterally, more on the left than on the right. There also foci of calcification in the distal left vertebral artery. No hyperdense vessels are evident. Skull: The bony calvarium appears intact. Sinuses/Orbits: The visualized paranasal sinuses appear normal. The lens of the left globe appears somewhat irregular in contour. Orbits otherwise appear normal bilaterally. Other: Mastoid air cells are clear. IMPRESSION: Age related volume loss with patchy periventricular small vessel disease. No acute infarct evident. No hemorrhage or mass effect. There are foci of carotid artery calcification. The left lens appears somewhat irregular by CT. Electronically Signed   By: Lowella Grip III M.D.   On: 07/16/2015 11:30   Mr Brain Wo Contrast  07/16/2015  CLINICAL DATA:  75 year old female with left lower extremity numbness in weakness acute onset this morning, now resolved. Initial encounter. EXAM: MRI HEAD WITHOUT CONTRAST TECHNIQUE: Multiplanar, multiecho pulse sequences of the brain and surrounding structures were obtained without intravenous contrast. COMPARISON:  Head CT without contrast 1121 hours today. FINDINGS: Cerebral volume is within normal limits for age. No restricted diffusion to suggest acute infarction. No midline shift, mass effect, evidence of mass lesion, ventriculomegaly, extra-axial collection or acute intracranial hemorrhage. Cervicomedullary junction and pituitary are within normal limits. Major intracranial vascular flow voids the are preserved. Patchy and confluent bilateral cerebral white matter T2 and FLAIR hyperintensity. No cortical encephalomalacia or chronic cerebral blood products. Comparatively  mild T2 heterogeneity in the deep gray matter nuclei and pons. Negative cerebellum. Visible internal auditory structures appear normal. Visualized paranasal sinuses and mastoids are well pneumatized. Postoperative changes to the left globe. Otherwise negative orbit and scalp soft tissues. Negative visualized cervical spine. Visualized bone marrow signal is within normal limits. IMPRESSION: 1.  No acute intracranial abnormality. 2. Moderate for age nonspecific signal changes in the brain, most commonly due to chronic small vessel disease. Electronically Signed   By: Genevie Ann M.D.   On: 07/16/2015 15:07   Mr Cervical Spine Wo Contrast  07/16/2015  CLINICAL DATA:  75 year old female with left lower extremity numbness in weakness acute onset this morning, now resolved. Initial encounter. EXAM: MRI CERVICAL SPINE WITHOUT CONTRAST TECHNIQUE: Multiplanar, multisequence MR imaging of the cervical spine was performed. No intravenous contrast was administered. COMPARISON:  Brain MRI from today reported separately. Cervical spine radiographs 04/17/2008. FINDINGS: Alignment: Improved cervical lordosis compared to 2010. Vertebrae: Mild degenerative appearing endplate marrow edema anteriorly and left laterally at C6-C7. Incidental superimposed C7 vertebral body hemangioma. No acute osseous abnormality identified. Cord: Spinal cord signal is within normal limits at all visualized levels. Cervicomedullary junction is within normal limits. Posterior Fossa, vertebral arteries, paraspinal tissues: Preserved major vascular flow voids in the neck. Negative paraspinal soft tissues. Disc levels: C2-C3: Moderate to severe left facet hypertrophy. Mild to moderate facet hypertrophy on the right. No spinal stenosis. Mild left C3 foraminal stenosis. C3-C4: Moderate to severe facet hypertrophy greater on the left. Mild disc  bulge and ligament flavum hypertrophy. No spinal stenosis. Moderate left greater than right C4 foraminal stenosis.  C4-C5: Moderate facet hypertrophy greater on the left. Mild disc bulge and uncovertebral hypertrophy. No spinal stenosis. Moderate left and mild right C5 foraminal stenosis. C5-C6: Circumferential disc osteophyte complex. Broad-based posterior component of disc. Mild facet hypertrophy. Uncovertebral hypertrophy. Borderline to mild spinal stenosis. Moderate left greater than right C6 foraminal stenosis. C6-C7: Disc space loss. Circumferential disc osteophyte complex. Broad-based posterior component. Mild to moderate facet hypertrophy greater on the right. Uncovertebral hypertrophy. Mild ligament flavum hypertrophy. Borderline to mild spinal stenosis. Moderate right greater than left C7 foraminal stenosis. C7-T1:  Mild to moderate facet hypertrophy.  No stenosis. No upper thoracic spinal stenosis despite a small central disc protrusion at T2-T3. IMPRESSION: 1. Degenerative findings consisting of predominantly facet arthropathy in the upper cervical spine, and disc and endplate degeneration in the lower cervical spine. 2. There is borderline to mild spinal stenosis at C5-C6 and C6-C7 with no spinal cord mass effect or signal abnormality. 3. There is widespread moderate cervical foraminal stenosis. 4.  No acute osseous abnormality identified. Electronically Signed   By: Genevie Ann M.D.   On: 07/16/2015 15:12   US Carotid Bilateral  07/17/2015  CLINICAL DATA:  Left-sided numbness, syncope and visual disturbance. EXAM: BILATERAL CAROTID DUPLEX ULTRASOUND TECHNIQUE: Pearline Cables scale imaging, color Doppler and duplex ultrasound were performed of bilateral carotid and vertebral arteries in the neck. COMPARISON:  None. FINDINGS: Criteria: Quantification of carotid stenosis is based on velocity parameters that correlate the residual internal carotid diameter with NASCET-based stenosis levels, using the diameter of the distal internal carotid lumen as the denominator for stenosis measurement. The following velocity measurements were  obtained: RIGHT ICA:  102/20 cm/sec CCA:  [id number] cm/sec SYSTOLIC ICA/CCA RATIO:  1.3 DIASTOLIC ICA/CCA RATIO:  1.1 ECA:  81 cm/sec LEFT ICA:  96/19 cm/sec CCA:  [id number] cm/sec SYSTOLIC ICA/CCA RATIO:  1.3 DIASTOLIC ICA/CCA RATIO:  1.4 ECA:  69 cm/sec RIGHT CAROTID ARTERY: Mild amount of partially calcified plaque is identified at the level of the carotid bulb. Minimal plaque in the proximal right ICA. Velocities and waveforms are normal and estimated right ICA stenosis is less than 50%. RIGHT VERTEBRAL ARTERY: Antegrade flow with normal waveform and velocity. LEFT CAROTID ARTERY: Mild amount of predominately calcified plaque is present at the level of the carotid bulb proximal left ICA. Velocities and waveforms are unremarkable estimated left ICA stenosis is less than 50%. The internal carotid artery is moderately tortuous. LEFT VERTEBRAL ARTERY: Antegrade flow with normal waveform and velocity. IMPRESSION: Mild amount of plaque at the level of both carotid bulbs and proximal internal carotid arteries, left greater than right. No significant carotid stenosis is identified with estimated bilateral ICA stenoses of less than 50%. Electronically Signed   By: Aletta Edouard M.D.   On: 07/17/2015 08:06   Dg Chest Portable 1 View  07/16/2015  CLINICAL DATA:  Left arm numbness and weakness. EXAM: PORTABLE CHEST 1 VIEW COMPARISON:  None. FINDINGS: Lungs are clear. Heart size and pulmonary vascularity are normal. No adenopathy. There is atherosclerotic calcification in the aortic arch. No adenopathy. No bone lesions. IMPRESSION: Aortic atherosclerosis.  No edema or consolidation. Electronically Signed   By: Lowella Grip III M.D.   On: 07/16/2015 11:23    EKG:   Orders placed or performed during the hospital encounter of 07/16/15  . ED EKG  . ED EKG  . EKG 12-Lead  . EKG 12-Lead  Management plans discussed with the patient, family and they are in agreement.  CODE STATUS:  Code Status History    Date  Active Date Inactive Code Status Order ID Comments User Context   07/16/2015 12:53 PM  Full Code [id number]  Loletha Grayer, MD ED      TOTAL TIME TAKING CARE OF THIS PATIENT: 40 minutes.    Theodoro Grist M.D on 07/17/2015 at 2:15 PM  Between 7am to 6pm - Pager - [phone]  After 6pm go to [email or web address] - password [email or web address] [facility]  [facility]  Office  [phone]  CC: Primary care physician; No PCP Per Patient

## 2015-07-17 NOTE — Care Management (Signed)
Admitted to [facility] under observation status with the diagnosis of left sided weakness. Lives with husband Elenore Rota [phone]). Takes care of all basic and instrumental activities of daily living herself, drives. Still works outside the home. Presented to the [facility] yesterday with left sided weakness. No Primary Care physician x 6 months. States her physician moved to [place].  Discussed [facility] and [facility] physician services. States she will work on getting a primary care physician. Shelbie Ammons RN MSN CCM Care Management [phone]

## 2015-07-17 NOTE — Progress Notes (Signed)
Received Md order to discharge patient to home,. Reviewed home meds prescriptions, discharge instructions and diet with patient and husband.  Patient verbalized understanding, discharge to home with nursing in wheelchair with family

## 2015-07-17 NOTE — Progress Notes (Signed)
OT Cancellation Note  Patient Details Name: Wendy Decker MRN: [id number] DOB: 03/07/1940   Cancelled Treatment:    Reason Eval/Treat Not Completed: OT screened, no needs identified, will sign off. Chart reviewed and met with patient.  She is at baseline with no OT needs at this time and screening only completed.  Please re-consult if status changes.  Thank you for the referral.  Chrys Racer, OTR/L ascom [phone] 07/17/2015, 9:24 AM

## 2015-07-17 NOTE — Consult Note (Signed)
Referring Physician: Leslye Peer    Chief Complaint: Left sided numbness and weakness  HPI: Wendy Decker is an 75 y.o. female who reports awakening normal yesterday.  While in the shower noted numbness in her left leg.  When out noted that her leg buckled frequently and she laid down.  Then noted numbness in her left arm and on the left corner of her mouth.  Objects felt heavy in her left arm and she had difficulty holding them.  Noticed some blurry vision as well.  Symptoms lasted 60-90 minutes and resolved spontaneously but patient was concerned and presented for evaluation.  Initial NIHSS of 0.  Date last known well: 07/16/2015 Time last known well: Time: 07:15 tPA Given: No: Resolution of symptoms  MRankin: 0  Past Medical History  Diagnosis Date  . Allergic rhinitis     Past Surgical History  Procedure Laterality Date  . Appendectomy      Family History  Problem Relation Age of Onset  . Pancreatic cancer Father   . CAD Father   . CAD Mother    Social History:  reports that she has never smoked. She does not have any smokeless tobacco history on file. She reports that she does not drink alcohol or use illicit drugs.  Allergies: No Known Allergies  Medications:  I have reviewed the patient's current medications. Prior to Admission:  Prescriptions prior to admission  Medication Sig Dispense Refill Last Dose  . cetirizine (ZYRTEC) 10 MG tablet Take 10 mg by mouth daily as needed for allergies.   PRN  . naproxen sodium (ANAPROX) 220 MG tablet Take 220 mg by mouth 2 (two) times daily as needed (for pain).   PRN at prn   Scheduled: . aspirin EC  81 mg Oral Daily  . atorvastatin  40 mg Oral q1800  . enoxaparin (LOVENOX) injection  40 mg Subcutaneous Q24H  . loratadine  10 mg Oral Daily  . sodium chloride flush  3 mL Intravenous Q12H    ROS: History obtained from the patient  General ROS: negative for - chills, fatigue, fever, night sweats, weight gain or weight  loss Psychological ROS: negative for - behavioral disorder, hallucinations, memory difficulties, mood swings or suicidal ideation Ophthalmic ROS: as noted in HPI ENT ROS: negative for - epistaxis, nasal discharge, oral lesions, sore throat, tinnitus or vertigo Allergy and Immunology ROS: negative for - hives or itchy/watery eyes Hematological and Lymphatic ROS: negative for - bleeding problems, bruising or swollen lymph nodes Endocrine ROS: negative for - galactorrhea, hair pattern changes, polydipsia/polyuria or temperature intolerance Respiratory ROS: negative for - cough, hemoptysis, shortness of breath or wheezing Cardiovascular ROS: negative for - chest pain, dyspnea on exertion, edema or irregular heartbeat Gastrointestinal ROS: negative for - abdominal pain, diarrhea, hematemesis, nausea/vomiting or stool incontinence Genito-Urinary ROS: negative for - dysuria, hematuria, incontinence or urinary frequency/urgency Musculoskeletal ROS: negative for - joint swelling or muscular weakness Neurological ROS: as noted in HPI Dermatological ROS: negative for rash and skin lesion changes  Physical Examination: Blood pressure 114/66, pulse 88, temperature 99 F (37.2 C), temperature source Oral, resp. rate 18, height 5\' 1"  (1.549 m), weight 52.164 kg (115 lb), SpO2 98 %.  HEENT-  Normocephalic, no lesions, without obvious abnormality.  Normal external eye and conjunctiva.  Normal TM's bilaterally.  Normal auditory canals and external ears. Normal external nose, mucus membranes and septum.  Normal pharynx. Cardiovascular- S1, S2 normal, pulses palpable throughout   Lungs- chest clear, no wheezing, rales, normal  symmetric air entry Abdomen- soft, non-tender; bowel sounds normal; no masses,  no organomegaly Extremities- no edema Lymph-no adenopathy palpable Musculoskeletal-no joint tenderness, deformity or swelling Skin-warm and dry, no hyperpigmentation, vitiligo, or suspicious  lesions  Neurological Examination Mental Status: Alert, oriented, thought content appropriate.  Speech fluent without evidence of aphasia.  Able to follow 3 step commands without difficulty. Cranial Nerves: II: Discs flat bilaterally; Visual fields grossly normal, pupils equal, round, reactive to light and accommodation III,IV, VI: ptosis not present, extra-ocular motions intact bilaterally V,VII: smile symmetric, facial light touch sensation normal bilaterally VIII: hearing normal bilaterally IX,X: gag reflex present XI: bilateral shoulder shrug XII: midline tongue extension Motor: Right : Upper extremity   5/5    Left:     Upper extremity   5/5  Lower extremity   5/5     Lower extremity   5/5 Tone and bulk:normal tone throughout; no atrophy noted Sensory: Pinprick and light touch intact throughout, bilaterally Deep Tendon Reflexes: 2+ in the upper extremities, trace at the knees and ankles.  Plantars: Right: downgoing   Left: downgoing Cerebellar: Normal finger-to-nose and normal heel-to-shin testing bilaterally Gait: normal gait and station    Laboratory Studies:  Basic Metabolic Panel:  Recent Labs Lab 07/16/15 1129 07/17/15 0626  NA 134* 134*  K 3.9 3.9  CL 102 105  CO2 23 23  GLUCOSE 106* 91  BUN 8 11  CREATININE 0.84 0.75  CALCIUM 9.0 8.6*    Liver Function Tests:  Recent Labs Lab 07/16/15 1129  AST 26  ALT 16  ALKPHOS 84  BILITOT 0.6  PROT 7.5  ALBUMIN 4.7   No results for input(s): LIPASE, AMYLASE in the last 168 hours. No results for input(s): AMMONIA in the last 168 hours.  CBC:  Recent Labs Lab 07/16/15 1129 07/17/15 0626  WBC 8.4 5.2  NEUTROABS 6.2  --   HGB 12.9 12.2  HCT 37.4 34.9*  MCV 84.4 83.3  PLT 302 263    Cardiac Enzymes:  Recent Labs Lab 07/16/15 1129  TROPONINI <0.03    BNP: Invalid input(s): POCBNP  CBG:  Recent Labs Lab 07/16/15 0944  GLUCAP 116*    Microbiology: No results found for this or any  previous visit.  Coagulation Studies:  Recent Labs  07/16/15 1129  LABPROT 12.8  INR 0.94    Urinalysis:  Recent Labs Lab 07/16/15 1129  COLORURINE COLORLESS*  LABSPEC 1.004*  PHURINE 7.0  GLUCOSEU NEGATIVE  HGBUR 1+*  BILIRUBINUR NEGATIVE  KETONESUR NEGATIVE  PROTEINUR NEGATIVE  NITRITE NEGATIVE  LEUKOCYTESUR NEGATIVE    Lipid Panel:    Component Value Date/Time   CHOL 143 07/17/2015 0626   TRIG 69 07/17/2015 0626   HDL 44 07/17/2015 0626   CHOLHDL 3.3 07/17/2015 0626   VLDL 14 07/17/2015 0626   LDLCALC 85 07/17/2015 0626    HgbA1C: No results found for: HGBA1C  Urine Drug Screen:  No results found for: LABOPIA, COCAINSCRNUR, LABBENZ, AMPHETMU, THCU, LABBARB  Alcohol Level: No results for input(s): ETH in the last 168 hours.  Other results: EKG: sinus rhythm at 94 bpm.  Imaging: Ct Head Wo Contrast  07/16/2015  CLINICAL DATA:  Left-sided weakness for 1 day ; mild blurred vision EXAM: CT HEAD WITHOUT CONTRAST TECHNIQUE: Contiguous axial images were obtained from the base of the skull through the vertex without intravenous contrast. COMPARISON:  None. FINDINGS: Brain: There is age related volume loss. There is no intracranial mass, hemorrhage, extra-axial fluid collection, or midline shift.  There is patchy small vessel disease throughout the centra semiovale bilaterally. Elsewhere gray-white compartments appear unremarkable. No acute infarct evident. Vascular: There is calcification in the cavernous carotid artery regions bilaterally, more on the left than on the right. There also foci of calcification in the distal left vertebral artery. No hyperdense vessels are evident. Skull: The bony calvarium appears intact. Sinuses/Orbits: The visualized paranasal sinuses appear normal. The lens of the left globe appears somewhat irregular in contour. Orbits otherwise appear normal bilaterally. Other: Mastoid air cells are clear. IMPRESSION: Age related volume loss with patchy  periventricular small vessel disease. No acute infarct evident. No hemorrhage or mass effect. There are foci of carotid artery calcification. The left lens appears somewhat irregular by CT. Electronically Signed   By: Lowella Grip III M.D.   On: 07/16/2015 11:30   Mr Brain Wo Contrast  07/16/2015  CLINICAL DATA:  75 year old female with left lower extremity numbness in weakness acute onset this morning, now resolved. Initial encounter. EXAM: MRI HEAD WITHOUT CONTRAST TECHNIQUE: Multiplanar, multiecho pulse sequences of the brain and surrounding structures were obtained without intravenous contrast. COMPARISON:  Head CT without contrast 1121 hours today. FINDINGS: Cerebral volume is within normal limits for age. No restricted diffusion to suggest acute infarction. No midline shift, mass effect, evidence of mass lesion, ventriculomegaly, extra-axial collection or acute intracranial hemorrhage. Cervicomedullary junction and pituitary are within normal limits. Major intracranial vascular flow voids the are preserved. Patchy and confluent bilateral cerebral white matter T2 and FLAIR hyperintensity. No cortical encephalomalacia or chronic cerebral blood products. Comparatively mild T2 heterogeneity in the deep gray matter nuclei and pons. Negative cerebellum. Visible internal auditory structures appear normal. Visualized paranasal sinuses and mastoids are well pneumatized. Postoperative changes to the left globe. Otherwise negative orbit and scalp soft tissues. Negative visualized cervical spine. Visualized bone marrow signal is within normal limits. IMPRESSION: 1.  No acute intracranial abnormality. 2. Moderate for age nonspecific signal changes in the brain, most commonly due to chronic small vessel disease. Electronically Signed   By: Genevie Ann M.D.   On: 07/16/2015 15:07   Mr Cervical Spine Wo Contrast  07/16/2015  CLINICAL DATA:  75 year old female with left lower extremity numbness in weakness acute onset  this morning, now resolved. Initial encounter. EXAM: MRI CERVICAL SPINE WITHOUT CONTRAST TECHNIQUE: Multiplanar, multisequence MR imaging of the cervical spine was performed. No intravenous contrast was administered. COMPARISON:  Brain MRI from today reported separately. Cervical spine radiographs 04/17/2008. FINDINGS: Alignment: Improved cervical lordosis compared to 2010. Vertebrae: Mild degenerative appearing endplate marrow edema anteriorly and left laterally at C6-C7. Incidental superimposed C7 vertebral body hemangioma. No acute osseous abnormality identified. Cord: Spinal cord signal is within normal limits at all visualized levels. Cervicomedullary junction is within normal limits. Posterior Fossa, vertebral arteries, paraspinal tissues: Preserved major vascular flow voids in the neck. Negative paraspinal soft tissues. Disc levels: C2-C3: Moderate to severe left facet hypertrophy. Mild to moderate facet hypertrophy on the right. No spinal stenosis. Mild left C3 foraminal stenosis. C3-C4: Moderate to severe facet hypertrophy greater on the left. Mild disc bulge and ligament flavum hypertrophy. No spinal stenosis. Moderate left greater than right C4 foraminal stenosis. C4-C5: Moderate facet hypertrophy greater on the left. Mild disc bulge and uncovertebral hypertrophy. No spinal stenosis. Moderate left and mild right C5 foraminal stenosis. C5-C6: Circumferential disc osteophyte complex. Broad-based posterior component of disc. Mild facet hypertrophy. Uncovertebral hypertrophy. Borderline to mild spinal stenosis. Moderate left greater than right C6 foraminal stenosis. C6-C7: Disc  space loss. Circumferential disc osteophyte complex. Broad-based posterior component. Mild to moderate facet hypertrophy greater on the right. Uncovertebral hypertrophy. Mild ligament flavum hypertrophy. Borderline to mild spinal stenosis. Moderate right greater than left C7 foraminal stenosis. C7-T1:  Mild to moderate facet  hypertrophy.  No stenosis. No upper thoracic spinal stenosis despite a small central disc protrusion at T2-T3. IMPRESSION: 1. Degenerative findings consisting of predominantly facet arthropathy in the upper cervical spine, and disc and endplate degeneration in the lower cervical spine. 2. There is borderline to mild spinal stenosis at C5-C6 and C6-C7 with no spinal cord mass effect or signal abnormality. 3. There is widespread moderate cervical foraminal stenosis. 4.  No acute osseous abnormality identified. Electronically Signed   By: Genevie Ann M.D.   On: 07/16/2015 15:12   US Carotid Bilateral  07/17/2015  CLINICAL DATA:  Left-sided numbness, syncope and visual disturbance. EXAM: BILATERAL CAROTID DUPLEX ULTRASOUND TECHNIQUE: Pearline Cables scale imaging, color Doppler and duplex ultrasound were performed of bilateral carotid and vertebral arteries in the neck. COMPARISON:  None. FINDINGS: Criteria: Quantification of carotid stenosis is based on velocity parameters that correlate the residual internal carotid diameter with NASCET-based stenosis levels, using the diameter of the distal internal carotid lumen as the denominator for stenosis measurement. The following velocity measurements were obtained: RIGHT ICA:  102/20 cm/sec CCA:  [id number] cm/sec SYSTOLIC ICA/CCA RATIO:  1.3 DIASTOLIC ICA/CCA RATIO:  1.1 ECA:  81 cm/sec LEFT ICA:  96/19 cm/sec CCA:  [id number] cm/sec SYSTOLIC ICA/CCA RATIO:  1.3 DIASTOLIC ICA/CCA RATIO:  1.4 ECA:  69 cm/sec RIGHT CAROTID ARTERY: Mild amount of partially calcified plaque is identified at the level of the carotid bulb. Minimal plaque in the proximal right ICA. Velocities and waveforms are normal and estimated right ICA stenosis is less than 50%. RIGHT VERTEBRAL ARTERY: Antegrade flow with normal waveform and velocity. LEFT CAROTID ARTERY: Mild amount of predominately calcified plaque is present at the level of the carotid bulb proximal left ICA. Velocities and waveforms are unremarkable estimated  left ICA stenosis is less than 50%. The internal carotid artery is moderately tortuous. LEFT VERTEBRAL ARTERY: Antegrade flow with normal waveform and velocity. IMPRESSION: Mild amount of plaque at the level of both carotid bulbs and proximal internal carotid arteries, left greater than right. No significant carotid stenosis is identified with estimated bilateral ICA stenoses of less than 50%. Electronically Signed   By: Aletta Edouard M.D.   On: 07/17/2015 08:06   Dg Chest Portable 1 View  07/16/2015  CLINICAL DATA:  Left arm numbness and weakness. EXAM: PORTABLE CHEST 1 VIEW COMPARISON:  None. FINDINGS: Lungs are clear. Heart size and pulmonary vascularity are normal. No adenopathy. There is atherosclerotic calcification in the aortic arch. No adenopathy. No bone lesions. IMPRESSION: Aortic atherosclerosis.  No edema or consolidation. Electronically Signed   By: Lowella Grip III M.D.   On: 07/16/2015 11:23    Assessment: 75 y.o. female presenting after an episode of left sided numbness and weakness.  Patient now at baseline.  On no antiplatelet therapy at home.  MRI of the brain personally reviewed and shows no acute changes.  Carotid dopplers show no evidence of hemodynamically significant stenosis.  Echocardiogram pending.  LDL 85.  TIA suspected.    Stroke Risk Factors - none  Plan: 1. Echocardiogram results pending 2. Prophylactic therapy-Antiplatelet med: Aspirin - dose 325mg  daily 3. Follow up with neurology on an outpatient basis.   4. Statin therapy with target LDL<70.   Alexis Goodell, MD  Neurology [phone] 07/17/2015, 10:41 AM

## 2015-07-17 NOTE — Care Management Obs Status (Signed)
[facility] NOTIFICATION   Patient Details  Name: Wendy Decker MRN: [id number] Date of Birth: 03/25/1940   Medicare Observation Status Notification Given:  Yes    Shelbie Ammons, RN 07/17/2015, 9:04 AM

## 2015-09-10 ENCOUNTER — Other Ambulatory Visit: Payer: Self-pay | Admitting: Family Medicine

## 2015-09-10 DIAGNOSIS — Z1231 Encounter for screening mammogram for malignant neoplasm of breast: Secondary | ICD-10-CM

## 2015-10-08 ENCOUNTER — Ambulatory Visit
Admission: RE | Admit: 2015-10-08 | Discharge: 2015-10-08 | Disposition: A | Discharge status: Home / Self Care | Payer: BLUE CROSS/BLUE SHIELD | Source: Ambulatory Visit | Attending: Family Medicine | Admitting: Family Medicine

## 2015-10-08 DIAGNOSIS — Z1231 Encounter for screening mammogram for malignant neoplasm of breast: Secondary | ICD-10-CM | POA: Diagnosis present

## 2016-03-16 ENCOUNTER — Encounter: Payer: Self-pay | Admitting: *Deleted

## 2016-03-17 ENCOUNTER — Encounter
Admission: RE | Disposition: A | Discharge status: Home / Self Care | Payer: Self-pay | Source: Ambulatory Visit | Attending: Unknown Physician Specialty

## 2016-03-17 ENCOUNTER — Encounter: Payer: Self-pay | Admitting: *Deleted

## 2016-03-17 ENCOUNTER — Ambulatory Visit: Payer: BLUE CROSS/BLUE SHIELD | Admitting: Anesthesiology

## 2016-03-17 ENCOUNTER — Ambulatory Visit
Admission: RE | Admit: 2016-03-17 | Discharge: 2016-03-17 | Disposition: A | Discharge status: Home / Self Care | Payer: BLUE CROSS/BLUE SHIELD | Source: Ambulatory Visit | Attending: Unknown Physician Specialty | Admitting: Unknown Physician Specialty

## 2016-03-17 DIAGNOSIS — Z7982 Long term (current) use of aspirin: Secondary | ICD-10-CM | POA: Insufficient documentation

## 2016-03-17 DIAGNOSIS — Z8619 Personal history of other infectious and parasitic diseases: Secondary | ICD-10-CM | POA: Diagnosis not present

## 2016-03-17 DIAGNOSIS — Z803 Family history of malignant neoplasm of breast: Secondary | ICD-10-CM | POA: Insufficient documentation

## 2016-03-17 DIAGNOSIS — M5030 Other cervical disc degeneration, unspecified cervical region: Secondary | ICD-10-CM | POA: Insufficient documentation

## 2016-03-17 DIAGNOSIS — J309 Allergic rhinitis, unspecified: Secondary | ICD-10-CM | POA: Diagnosis not present

## 2016-03-17 DIAGNOSIS — Z86010 Personal history of colonic polyps: Secondary | ICD-10-CM | POA: Insufficient documentation

## 2016-03-17 DIAGNOSIS — D120 Benign neoplasm of cecum: Secondary | ICD-10-CM | POA: Diagnosis not present

## 2016-03-17 DIAGNOSIS — Z79899 Other long term (current) drug therapy: Secondary | ICD-10-CM | POA: Diagnosis not present

## 2016-03-17 DIAGNOSIS — Z8673 Personal history of transient ischemic attack (TIA), and cerebral infarction without residual deficits: Secondary | ICD-10-CM | POA: Insufficient documentation

## 2016-03-17 DIAGNOSIS — Z1211 Encounter for screening for malignant neoplasm of colon: Secondary | ICD-10-CM | POA: Diagnosis not present

## 2016-03-17 DIAGNOSIS — Z800 Family history of malignant neoplasm of digestive organs: Secondary | ICD-10-CM | POA: Insufficient documentation

## 2016-03-17 DIAGNOSIS — Z8249 Family history of ischemic heart disease and other diseases of the circulatory system: Secondary | ICD-10-CM | POA: Insufficient documentation

## 2016-03-17 HISTORY — DX: Cerebral infarction, unspecified: I63.9

## 2016-03-17 HISTORY — DX: Personal history of other infectious and parasitic diseases: Z86.19

## 2016-03-17 HISTORY — DX: Personal history of colon polyps, unspecified: Z86.0100

## 2016-03-17 HISTORY — PX: COLONOSCOPY WITH PROPOFOL: SHX5780

## 2016-03-17 HISTORY — DX: Other cervical disc degeneration, unspecified cervical region: M50.30

## 2016-03-17 HISTORY — DX: Personal history of colonic polyps: Z86.010

## 2016-03-17 SURGERY — COLONOSCOPY WITH PROPOFOL
Anesthesia: General

## 2016-03-17 MED ORDER — METHYLENE BLUE 0.5 % INJ SOLN
INTRAVENOUS | Status: AC
  Filled 2016-03-17: qty 10

## 2016-03-17 MED ORDER — PROPOFOL 500 MG/50ML IV EMUL
INTRAVENOUS | Status: DC | PRN
  Administered 2016-03-17: 120 ug/kg/min via INTRAVENOUS

## 2016-03-17 MED ORDER — LIDOCAINE HCL (PF) 1 % IJ SOLN
2.0000 mL | Freq: Once | INTRAMUSCULAR | Status: AC
  Administered 2016-03-17: 0.3 mL via INTRADERMAL
  Filled 2016-03-17: qty 2

## 2016-03-17 MED ORDER — SODIUM CHLORIDE 0.9 % IV SOLN: INTRAVENOUS | Status: DC

## 2016-03-17 MED ORDER — FENTANYL CITRATE (PF) 100 MCG/2ML IJ SOLN
INTRAMUSCULAR | Status: AC
Start: 2016-03-17 — End: 2016-03-17
  Filled 2016-03-17: qty 2

## 2016-03-17 MED ORDER — FENTANYL CITRATE (PF) 100 MCG/2ML IJ SOLN
INTRAMUSCULAR | Status: DC | PRN
  Administered 2016-03-17: 50 ug via INTRAVENOUS

## 2016-03-17 MED ORDER — METHYLENE BLUE 0.5 % INJ SOLN
INTRAVENOUS | Status: DC | PRN
  Administered 2016-03-17: 1 mL via SUBMUCOSAL

## 2016-03-17 MED ORDER — EPHEDRINE SULFATE 50 MG/ML IJ SOLN
INTRAMUSCULAR | Status: AC
  Filled 2016-03-17: qty 1

## 2016-03-17 MED ORDER — EPHEDRINE SULFATE 50 MG/ML IJ SOLN
INTRAMUSCULAR | Status: DC | PRN
  Administered 2016-03-17 (×3): 5 mg via INTRAVENOUS

## 2016-03-17 MED ORDER — PROPOFOL 500 MG/50ML IV EMUL
INTRAVENOUS | Status: AC
  Filled 2016-03-17: qty 50

## 2016-03-17 MED ORDER — MIDAZOLAM HCL 2 MG/2ML IJ SOLN
INTRAMUSCULAR | Status: AC
  Filled 2016-03-17: qty 2

## 2016-03-17 MED ORDER — MIDAZOLAM HCL 2 MG/2ML IJ SOLN
INTRAMUSCULAR | Status: DC | PRN
  Administered 2016-03-17: 2 mg via INTRAVENOUS

## 2016-03-17 MED ORDER — SODIUM CHLORIDE 0.9 % IV SOLN
INTRAVENOUS | Status: DC
  Administered 2016-03-17: 1000 mL via INTRAVENOUS

## 2016-03-17 NOTE — Anesthesia Preprocedure Evaluation (Signed)
Anesthesia Evaluation  Patient identified by MRN, date of birth, ID band Patient awake    Reviewed: Allergy & Precautions, NPO status , Patient's Chart, lab work & pertinent test results  Airway Mallampati: II       Dental  (+) Teeth Intact   Pulmonary neg pulmonary ROS,    Pulmonary exam normal        Cardiovascular Exercise Tolerance: Good  Rhythm:Regular     Neuro/Psych TIAnegative psych ROS   GI/Hepatic negative GI ROS, Neg liver ROS,   Endo/Other  negative endocrine ROS  Renal/GU negative Renal ROS     Musculoskeletal   Abdominal Normal abdominal exam  (+)   Peds negative pediatric ROS (+)  Hematology negative hematology ROS (+)   Anesthesia Other Findings   Reproductive/Obstetrics                             Anesthesia Physical Anesthesia Plan  ASA: II  Anesthesia Plan: General   Post-op Pain Management:    Induction: Intravenous  Airway Management Planned: Natural Airway and Nasal Cannula  Additional Equipment:   Intra-op Plan:   Post-operative Plan:   Informed Consent: I have reviewed the patients History and Physical, chart, labs and discussed the procedure including the risks, benefits and alternatives for the proposed anesthesia with the patient or authorized representative who has indicated his/her understanding and acceptance.     Plan Discussed with: CRNA  Anesthesia Plan Comments:         Anesthesia Quick Evaluation

## 2016-03-17 NOTE — Anesthesia Post-op Follow-up Note (Cosign Needed)
Anesthesia QCDR form completed.        

## 2016-03-17 NOTE — Transfer of Care (Signed)
Immediate Anesthesia Transfer of Care Note  Patient: Wendy Decker  Procedure(s) Performed: Procedure(s): COLONOSCOPY WITH PROPOFOL (N/A)  Patient Location: PACU and Endoscopy Unit  Anesthesia Type:General  Level of Consciousness: sedated and responds to stimulation  Airway & Oxygen Therapy: Patient Spontanous Breathing and Patient connected to nasal cannula oxygen  Post-op Assessment: Report given to RN and Post -op Vital signs reviewed and stable  Post vital signs: Reviewed and stable  Last Vitals:  Vitals:   03/17/16 1224 03/17/16 1442  BP: 140/66 (!) 98/51  Pulse: 80 80  Resp: 18 16  Temp: 36.2 C     Last Pain: There were no vitals filed for this visit.       Complications: No apparent anesthesia complications

## 2016-03-17 NOTE — Anesthesia Procedure Notes (Signed)
Performed by: Vaughan Sine Pre-anesthesia Checklist: Patient identified, Emergency Drugs available, Suction available, Patient being monitored and Timeout performed Patient Re-evaluated:Patient Re-evaluated prior to inductionPreoxygenation: Pre-oxygenation with 100% oxygen Intubation Type: IV induction Placement Confirmation: positive ETCO2 and CO2 detector

## 2016-03-17 NOTE — H&P (Signed)
   Primary Care Physician:  Dion Body, MD Primary Gastroenterologist:  Dr. Vira Agar  Pre-Procedure History & Physical: HPI:  Wendy Decker is a 76 y.o. female is here for an colonoscopy.   Past Medical History:  Diagnosis Date  . Allergic rhinitis   . DDD (degenerative disc disease), cervical   . History of colon polyps   . History of shingles   . Stroke [facility])    HX.OF TIA    Past Surgical History:  Procedure Laterality Date  . APPENDECTOMY    . TUBAL LIGATION      Prior to Admission medications   Medication Sig Start Date End Date Taking? Authorizing Provider  aspirin EC 325 MG tablet Take 1 tablet (325 mg total) by mouth daily. 07/17/15   Theodoro Grist, MD  atorvastatin (LIPITOR) 40 MG tablet Take 1 tablet (40 mg total) by mouth daily at 6 PM. 07/17/15   Theodoro Grist, MD  cetirizine (ZYRTEC) 10 MG tablet Take 10 mg by mouth daily as needed for allergies.    Historical Provider, MD    Allergies as of 03/12/2016  . (No Known Allergies)    Family History  Problem Relation Age of Onset  . Pancreatic cancer Father   . CAD Father   . CAD Mother   . Breast cancer Sister 28    Social History   Social History  . Marital status: Married    Spouse name: N/A  . Number of children: N/A  . Years of education: N/A   Occupational History  . Not on file.   Social History Main Topics  . Smoking status: Never Smoker  . Smokeless tobacco: Never Used  . Alcohol use No  . Drug use: No  . Sexual activity: Not on file   Other Topics Concern  . Not on file   Social History Narrative  . No narrative on file    Review of Systems: See HPI, otherwise negative ROS  Physical Exam: BP 140/66   Pulse 80   Temp 97.2 F (36.2 C)   Resp 18   Ht 5\' 1"  (1.549 m)   Wt 49.9 kg (110 lb)   SpO2 100%   BMI 20.78 kg/m  General:   Alert,  pleasant and cooperative in NAD Head:  Normocephalic and atraumatic. Neck:  Supple; no masses or thyromegaly. Lungs:  Clear  throughout to auscultation.    Heart:  Regular rate and rhythm. Abdomen:  Soft, nontender and nondistended. Normal bowel sounds, without guarding, and without rebound.   Neurologic:  Alert and  oriented x4;  grossly normal neurologically.  Impression/Plan: Wendy Decker is here for an colonoscopy to be performed for screening colonoscopy  Risks, benefits, limitations, and alternatives regarding  colonoscopy have been reviewed with the patient.  Questions have been answered.  All parties agreeable.   Gaylyn Cheers, MD  03/17/2016, 1:21 PM

## 2016-03-17 NOTE — Anesthesia Postprocedure Evaluation (Signed)
Anesthesia Post Note  Patient: Wendy Decker  Procedure(s) Performed: Procedure(s) (LRB): COLONOSCOPY WITH PROPOFOL (N/A)  Patient location during evaluation: Endoscopy Anesthesia Type: General Level of consciousness: awake and alert and oriented Pain management: pain level controlled Vital Signs Assessment: post-procedure vital signs reviewed and stable Respiratory status: spontaneous breathing, nonlabored ventilation and respiratory function stable Cardiovascular status: blood pressure returned to baseline and stable Postop Assessment: no signs of nausea or vomiting Anesthetic complications: no     Last Vitals:  Vitals:   03/17/16 1442 03/17/16 1502  BP: (!) 98/51 133/78  Pulse: 80 84  Resp: 16 (!) 21  Temp: 36.1 C     Last Pain:  Vitals:   03/17/16 1442  TempSrc: Tympanic                 Maisee Vollman

## 2016-03-17 NOTE — Op Note (Addendum)
[facility] Gastroenterology Patient Name: Wendy Decker Procedure Date: 03/17/2016 1:23 PM MRN: [id number] Account #: [id number] Date of Birth: 1940-05-29 Admit Type: Outpatient Age: 76 Room: [facility] ENDO ROOM 4 Gender: Female Note Status: Finalized Procedure:            Colonoscopy Indications:          Screening for colorectal malignant neoplasm Providers:            Manya Silvas, MD Referring MD:         Dion Body (Referring MD) Medicines:            Propofol per Anesthesia Complications:        No immediate complications. Procedure:            Pre-Anesthesia Assessment:                       - After reviewing the risks and benefits, the patient                        was deemed in satisfactory condition to undergo the                        procedure.                       After obtaining informed consent, the colonoscope was                        passed under direct vision. Throughout the procedure,                        the patient's blood pressure, pulse, and oxygen                        saturations were monitored continuously. The                        Colonoscope was introduced through the anus and                        advanced to the the cecum, identified by appendiceal                        orifice and ileocecal valve. The colonoscopy was                        performed with difficulty. The patient tolerated the                        procedure well. The quality of the bowel preparation                        was good. Findings:      A 35 mm polyp was found in the cecum. The polyp was sessile. The polyp       was removed with a saline injection-lift technique using a hot snare.       Resection and retrieval were complete. It was done piecemeal due to the       size, likely 4-5 cm in all. Saline and methylene blue used lift was done       with piecemeal  removal and clips were applied to the area but was not       able to get the base  closed (could not bring the walls together). No       bleeding seen after removal. Several pieces recovered but several not.      No other significant polyps seen. The edges were treated with argon at       right colon setting. Impression:           - One 35 mm polyp in the cecum, removed using                        injection-lift and a hot snare. Resected and retrieved. Recommendation:       - Await pathology results. Manya Silvas, MD 03/17/2016 2:41:48 PM This report has been signed electronically. Number of Addenda: 0 Note Initiated On: 03/17/2016 1:23 PM Total Procedure Duration: 0 hours 6 minutes 30 seconds       [facility]

## 2016-03-18 ENCOUNTER — Encounter: Payer: Self-pay | Admitting: Unknown Physician Specialty

## 2016-03-19 LAB — SURGICAL PATHOLOGY

## 2016-03-22 ENCOUNTER — Encounter: Payer: Self-pay | Admitting: Emergency Medicine

## 2016-03-22 ENCOUNTER — Inpatient Hospital Stay
Admission: EM | Admit: 2016-03-22 | Discharge: 2016-03-24 | DRG: 378 | Disposition: A | Discharge status: Home / Self Care | Payer: BLUE CROSS/BLUE SHIELD | Attending: Internal Medicine | Admitting: Internal Medicine

## 2016-03-22 DIAGNOSIS — Z7982 Long term (current) use of aspirin: Secondary | ICD-10-CM | POA: Diagnosis not present

## 2016-03-22 DIAGNOSIS — Z86010 Personal history of colonic polyps: Secondary | ICD-10-CM | POA: Diagnosis not present

## 2016-03-22 DIAGNOSIS — D62 Acute posthemorrhagic anemia: Secondary | ICD-10-CM | POA: Diagnosis present

## 2016-03-22 DIAGNOSIS — R739 Hyperglycemia, unspecified: Secondary | ICD-10-CM | POA: Diagnosis present

## 2016-03-22 DIAGNOSIS — K625 Hemorrhage of anus and rectum: Secondary | ICD-10-CM | POA: Diagnosis not present

## 2016-03-22 DIAGNOSIS — R531 Weakness: Secondary | ICD-10-CM

## 2016-03-22 DIAGNOSIS — Z8619 Personal history of other infectious and parasitic diseases: Secondary | ICD-10-CM | POA: Diagnosis not present

## 2016-03-22 DIAGNOSIS — E871 Hypo-osmolality and hyponatremia: Secondary | ICD-10-CM | POA: Diagnosis present

## 2016-03-22 DIAGNOSIS — E860 Dehydration: Secondary | ICD-10-CM | POA: Diagnosis present

## 2016-03-22 DIAGNOSIS — Z9889 Other specified postprocedural states: Secondary | ICD-10-CM

## 2016-03-22 DIAGNOSIS — Z8673 Personal history of transient ischemic attack (TIA), and cerebral infarction without residual deficits: Secondary | ICD-10-CM

## 2016-03-22 DIAGNOSIS — Z79899 Other long term (current) drug therapy: Secondary | ICD-10-CM | POA: Diagnosis not present

## 2016-03-22 DIAGNOSIS — D500 Iron deficiency anemia secondary to blood loss (chronic): Secondary | ICD-10-CM

## 2016-03-22 DIAGNOSIS — J309 Allergic rhinitis, unspecified: Secondary | ICD-10-CM | POA: Diagnosis present

## 2016-03-22 LAB — TYPE AND SCREEN
ABO/RH(D): A NEG
ANTIBODY SCREEN: NEGATIVE

## 2016-03-22 LAB — HEMOGLOBIN
HEMOGLOBIN: 9.5 g/dL — AB (ref 12.0–16.0)
Hemoglobin: 10.4 g/dL — ABNORMAL LOW (ref 12.0–16.0)

## 2016-03-22 LAB — COMPREHENSIVE METABOLIC PANEL
ALBUMIN: 3.9 g/dL (ref 3.5–5.0)
ALT: 11 U/L — ABNORMAL LOW (ref 14–54)
ANION GAP: 6 (ref 5–15)
AST: 20 U/L (ref 15–41)
Alkaline Phosphatase: 72 U/L (ref 38–126)
BILIRUBIN TOTAL: 0.4 mg/dL (ref 0.3–1.2)
BUN: 6 mg/dL (ref 6–20)
CHLORIDE: 100 mmol/L — AB (ref 101–111)
CO2: 25 mmol/L (ref 22–32)
Calcium: 9 mg/dL (ref 8.9–10.3)
Creatinine, Ser: 0.78 mg/dL (ref 0.44–1.00)
GFR calc Af Amer: >60 mL/min (ref >60)
GFR calc non Af Amer: >60 mL/min (ref >60)
GLUCOSE: 107 mg/dL — AB (ref 65–99)
POTASSIUM: 4 mmol/L (ref 3.5–5.1)
SODIUM: 131 mmol/L — AB (ref 135–145)
Total Protein: 6.8 g/dL (ref 6.5–8.1)

## 2016-03-22 LAB — CBC
HEMATOCRIT: 28.8 % — AB (ref 35.0–47.0)
HEMOGLOBIN: 10 g/dL — AB (ref 12.0–16.0)
MCH: 28.2 pg (ref 26.0–34.0)
MCHC: 34.7 g/dL (ref 32.0–36.0)
MCV: 81.4 fL (ref 80.0–100.0)
Platelets: 286 10*3/uL (ref 150–440)
RBC: 3.54 MIL/uL — ABNORMAL LOW (ref 3.80–5.20)
RDW: 14.3 % (ref 11.5–14.5)
WBC: 4.8 10*3/uL (ref 3.6–11.0)

## 2016-03-22 MED ORDER — SODIUM CHLORIDE 0.9 % IV SOLN
INTRAVENOUS | Status: DC
  Administered 2016-03-22: 15:00:00 via INTRAVENOUS

## 2016-03-22 MED ORDER — ONDANSETRON HCL 4 MG PO TABS: 4.0000 mg | ORAL_TABLET | Freq: Four times a day (QID) | ORAL | Status: DC | PRN

## 2016-03-22 MED ORDER — ACETAMINOPHEN 650 MG RE SUPP: 650.0000 mg | Freq: Four times a day (QID) | RECTAL | Status: DC | PRN

## 2016-03-22 MED ORDER — ONDANSETRON HCL 4 MG/2ML IJ SOLN: 4.0000 mg | Freq: Four times a day (QID) | INTRAMUSCULAR | Status: DC | PRN

## 2016-03-22 MED ORDER — LORATADINE 10 MG PO TABS: 10.0000 mg | ORAL_TABLET | Freq: Every day | ORAL | Status: DC

## 2016-03-22 MED ORDER — HYDROCODONE-ACETAMINOPHEN 5-325 MG PO TABS: 1.0000 | ORAL_TABLET | ORAL | Status: DC | PRN

## 2016-03-22 MED ORDER — SENNOSIDES-DOCUSATE SODIUM 8.6-50 MG PO TABS: 1.0000 | ORAL_TABLET | Freq: Every evening | ORAL | Status: DC | PRN

## 2016-03-22 MED ORDER — ACETAMINOPHEN 325 MG PO TABS: 650.0000 mg | ORAL_TABLET | Freq: Four times a day (QID) | ORAL | Status: DC | PRN

## 2016-03-22 NOTE — H&P (Signed)
Family Meeting Note  Advance Directive:no  Today a meeting took place with the Patient. And spouse  The following clinical team members were present during this meeting:MD  The following were discussed:Patient's diagnosis: rectal bleeding, Patient's progosis: Unable to determine and Goals for treatment: Full Code  Additional follow-up to be provided:  Chaplain consult Time spent during discussion:16 minutes  Wendy Decker

## 2016-03-22 NOTE — H&P (Signed)
[facility] [place] NAME: Wendy Decker    MR#:  [id number]  DATE OF BIRTH:  [birth year]  DATE OF ADMISSION:  03/22/2016  PRIMARY CARE PHYSICIAN: Dion Body, MD   REQUESTING/REFERRING PHYSICIAN: Dr. Corky Downs  CHIEF COMPLAINT:   Rectal bleeding HISTORY OF PRESENT ILLNESS:  Wendy Decker  is a 76 y.o. female with a known history of Allergic rhinitis to underwent polypectomy on Wednesday. Patient reports since Friday she has developed rectal bleeding. She does report that the initial red blood has now turned maroon color. She had abdominal pain initially on Friday but not has abdominal pain since then. She denies fever or chills. She does does state she feels weak overall.  ER physician spoke with Dr. Vira Agar who is recommending consultation from vascular surgery for possible embolization.  PAST MEDICAL HISTORY:   Past Medical History:  Diagnosis Date  . Allergic rhinitis   . DDD (degenerative disc disease), cervical   . History of colon polyps   . History of shingles   . Stroke ([place])    HX.OF TIA    PAST SURGICAL HISTORY:   Past Surgical History:  Procedure Laterality Date  . APPENDECTOMY    . COLONOSCOPY WITH PROPOFOL N/A 03/17/2016   Procedure: COLONOSCOPY WITH PROPOFOL;  Surgeon: Manya Silvas, MD;  Location: [facility] ENDOSCOPY;  Service: Endoscopy;  Laterality: N/A;  . TUBAL LIGATION      SOCIAL HISTORY:   Social History  Substance Use Topics  . Smoking status: Never Smoker  . Smokeless tobacco: Never Used  . Alcohol use No    FAMILY HISTORY:   Family History  Problem Relation Age of Onset  . Pancreatic cancer Father   . CAD Father   . CAD Mother   . Breast cancer Sister 3    DRUG ALLERGIES:  No Known Allergies  REVIEW OF SYSTEMS:   Review of Systems  Constitutional: Negative for chills, fever and malaise/fatigue.  HENT: Negative.  Negative for ear discharge, ear pain, hearing loss, nosebleeds and sore  throat.   Eyes: Negative.  Negative for blurred vision and pain.  Respiratory: Negative.  Negative for cough, hemoptysis, shortness of breath and wheezing.   Cardiovascular: Negative.  Negative for chest pain, palpitations and leg swelling.  Gastrointestinal: Positive for blood in stool. Negative for abdominal pain, diarrhea, nausea and vomiting.  Genitourinary: Negative.  Negative for dysuria.  Musculoskeletal: Negative.  Negative for back pain.  Skin: Negative.   Neurological: Positive for weakness. Negative for dizziness, tremors, speech change, focal weakness, seizures and headaches.  Endo/Heme/Allergies: Negative.  Does not bruise/bleed easily.  Psychiatric/Behavioral: Negative.  Negative for depression, hallucinations and suicidal ideas.    MEDICATIONS AT HOME:   Prior to Admission medications   Medication Sig Start Date End Date Taking? Authorizing Provider  aspirin EC 81 MG tablet Take 81 mg by mouth daily.   Yes Historical Provider, MD                cetirizine (ZYRTEC) 10 MG tablet Take 10 mg by mouth daily as needed for allergies.    Historical Provider, MD      VITAL SIGNS:  Blood pressure 131/68, pulse 97, temperature 98.2 F (36.8 C), temperature source Oral, resp. rate 18, weight 49.9 kg (110 lb), SpO2 100 %.  PHYSICAL EXAMINATION:   Physical Exam  Constitutional: She is oriented to person, place, and time and well-developed, well-nourished, and in no distress. No distress.  HENT:  Head:  Normocephalic.  Eyes: No scleral icterus.  Neck: Normal range of motion. Neck supple. No JVD present. No tracheal deviation present.  Cardiovascular: Normal rate, regular rhythm and normal heart sounds.  Exam reveals no gallop and no friction rub.   No murmur heard. Pulmonary/Chest: Effort normal and breath sounds normal. No respiratory distress. She has no wheezes. She has no rales. She exhibits no tenderness.  Abdominal: Soft. Bowel sounds are normal. She exhibits no distension  and no mass. There is no tenderness. There is no rebound and no guarding.  Musculoskeletal: Normal range of motion. She exhibits no edema.  Neurological: She is alert and oriented to person, place, and time.  Skin: Skin is warm. No rash noted. No erythema.  Psychiatric: Affect and judgment normal.      LABORATORY PANEL:   CBC  Recent Labs Lab 03/22/16 0944  WBC 4.8  HGB 10.0*  HCT 28.8*  PLT 286   ------------------------------------------------------------------------------------------------------------------  Chemistries   Recent Labs Lab 03/22/16 0944  NA 131*  K 4.0  CL 100*  CO2 25  GLUCOSE 107*  BUN 6  CREATININE 0.78  CALCIUM 9.0  AST 20  ALT 11*  ALKPHOS 72  BILITOT 0.4   ------------------------------------------------------------------------------------------------------------------  Cardiac Enzymes No results for input(s): TROPONINI in the last 168 hours. ------------------------------------------------------------------------------------------------------------------  RADIOLOGY:  No results found.  EKG:     IMPRESSION AND PLAN:   76 year old female who underwent polypectomy on Wednesday and since that time has had rectal bleeding.  1. Rectal bleeding with acute blood loss anemia: Patient will have consultation with Dr. Lucky Cowboy for possible embolization. Monitor hemoglobin every 6-8 hours and transfuse if hemoglobin less than 7. Hold aspirin for now  2. Hyponatremia from poor by mouth intake Start IV fluids and repeat sodium in a.m.     All the records are reviewed and case discussed with ED provider. Management plans discussed with the patient and she is in agreement  CODE STATUS: FULL  TOTAL TIME TAKING CARE OF THIS PATIENT: 40 minutes.    Bill Mcvey M.D on 03/22/2016 at 11:56 AM  Between 7am to 6pm - Pager - [phone]  After 6pm go to [email or web address] - password [email or web address] [facility]  Office   [phone]  CC: Primary care physician; Dion Body, MD

## 2016-03-22 NOTE — ED Notes (Signed)
Attempted IV start x 2 unsuccessful.

## 2016-03-22 NOTE — Consult Note (Signed)
Patient with delayed lower GI bleeding 3 days after polypectomy of a very large polyp in ascending/cecal area.  She appears to have stopped bleeding given no further blood passed since admission  And a stable hgb.  I spoke with her and family this evening and if no bleeding would recommend 48 hour admission.  Notes from Dr. Lucky Cowboy, and  Dr. Benjie Karvonen reviewed and I agree with the current plans and the tentative plan in case rebleeding occurs.  Will follow with you.

## 2016-03-22 NOTE — ED Provider Notes (Signed)
[facility] Emergency Department Provider Note   ____________________________________________    I have reviewed the triage vital signs and the nursing notes.   HISTORY  Chief Complaint Rectal Bleeding     HPI Wendy Decker is a 76 y.o. female who presents with complaints of rectal bleeding. Patient reports she had a colonoscopy 6 days ago performed by Dr. Vira Agar.. She was doing well until 2 days after the procedure when she developed rectal bleeding. She had multiple bloody bowel movements over the weekend. She contacted Dr. Percell Boston office and he recommended that she come to the emergency department. She denies abdominal pain.intermittent dizziness   Past Medical History:  Diagnosis Date  . Allergic rhinitis   . DDD (degenerative disc disease), cervical   . History of colon polyps   . History of shingles   . Stroke [facility])    HX.OF TIA    Patient Active Problem List   Diagnosis Date Noted  . TIA (transient ischemic attack) 07/17/2015  . Hyponatremia 07/17/2015  . Left-sided weakness 07/17/2015  . DDD (degenerative disc disease), cervical 07/17/2015  . Spinal stenosis in cervical region 07/17/2015  . Left sided numbness 07/16/2015    Past Surgical History:  Procedure Laterality Date  . APPENDECTOMY    . COLONOSCOPY WITH PROPOFOL N/A 03/17/2016   Procedure: COLONOSCOPY WITH PROPOFOL;  Surgeon: Manya Silvas, MD;  Location: [facility] ENDOSCOPY;  Service: Endoscopy;  Laterality: N/A;  . TUBAL LIGATION      Prior to Admission medications   Medication Sig Start Date End Date Taking? Authorizing Provider  aspirin EC 81 MG tablet Take 81 mg by mouth daily.   Yes Historical Provider, MD  aspirin EC 325 MG tablet Take 1 tablet (325 mg total) by mouth daily. Patient not taking: Reported on 03/22/2016 07/17/15   Theodoro Grist, MD  atorvastatin (LIPITOR) 40 MG tablet Take 1 tablet (40 mg total) by mouth daily at 6 PM. Patient not taking: Reported on  03/22/2016 07/17/15   Theodoro Grist, MD  cetirizine (ZYRTEC) 10 MG tablet Take 10 mg by mouth daily as needed for allergies.    Historical Provider, MD     Allergies Patient has no known allergies.  Family History  Problem Relation Age of Onset  . Pancreatic cancer Father   . CAD Father   . CAD Mother   . Breast cancer Sister 25    Social History Social History  Substance Use Topics  . Smoking status: Never Smoker  . Smokeless tobacco: Never Used  . Alcohol use No    Review of Systems  Constitutional: No fever/chills Eyes: No visual changes.   Cardiovascular: Denies chest pain. Respiratory: Denies shortness of breath. Gastrointestinal: as above   Musculoskeletal: Negative for back pain. Skin: Negative for rash. Neurological: Negative for headaches  10-point ROS otherwise negative.  ____________________________________________   PHYSICAL EXAM:  VITAL SIGNS: ED Triage Vitals  Enc Vitals Group     BP 03/22/16 0941 131/68     Pulse Rate 03/22/16 0941 97     Resp 03/22/16 0941 18     Temp 03/22/16 0941 98.2 F (36.8 C)     Temp Source 03/22/16 0941 Oral     SpO2 03/22/16 0941 100 %     Weight 03/22/16 0942 110 lb (49.9 kg)     Height --      Head Circumference --      Peak Flow --      Pain Score 03/22/16 0942  0     Pain Loc --      Pain Edu? --      Excl. in [place]? --     Constitutional: Alert and oriented. No acute distress. Pleasant and interactive Eyes: Conjunctivae are normal.   Nose: No congestion/rhinnorhea. Mouth/Throat: Mucous membranes are moist.    Cardiovascular: Normal rate, regular rhythm. Grossly normal heart sounds.  Good peripheral circulation. Respiratory: Normal respiratory effort.  No retractions. Lungs CTAB. Gastrointestinal: Soft and nontender. No distention.  No CVA tenderness. Genitourinary: deferred Musculoskeletal: No lower extremity tenderness nor edema.  Warm and well perfused Neurologic:  Normal speech and language. No gross  focal neurologic deficits are appreciated.  Skin:  Skin is warm, dry and intact. No rash noted. Psychiatric: Mood and affect are normal. Speech and behavior are normal.  ____________________________________________   LABS (all labs ordered are listed, but only abnormal results are displayed)  Labs Reviewed  COMPREHENSIVE METABOLIC PANEL - Abnormal; Notable for the following:       Result Value   Sodium 131 (*)    Chloride 100 (*)    Glucose, Bld 107 (*)    ALT 11 (*)    All other components within normal limits  CBC - Abnormal; Notable for the following:    RBC 3.54 (*)    Hemoglobin 10.0 (*)    HCT 28.8 (*)    All other components within normal limits  POC OCCULT BLOOD, ED  TYPE AND SCREEN   ____________________________________________  EKG  None ____________________________________________  RADIOLOGY  None ____________________________________________   PROCEDURES  Procedure(s) performed: No    Critical Care performed: No ____________________________________________   INITIAL IMPRESSION / ASSESSMENT AND PLAN / ED COURSE  Pertinent labs & imaging results that were available during my care of the patient were reviewed by me and considered in my medical decision making (see chart for details).  Patient's hemoglobin is 10.0, this appears to be down from approximately 12.2 6 months ago. Discussed with Dr. Vira Agar, he has already discussed with vascular surgery for likely embolization, recommends admission to hospitalist service. No need for blood products at this time   ____________________________________________   FINAL CLINICAL IMPRESSION(S) / ED DIAGNOSES  Final diagnoses:  Status post colonoscopy with polypectomy  Rectal bleeding      NEW MEDICATIONS STARTED DURING THIS VISIT:  New Prescriptions   No medications on file     Note:  This document was prepared using Dragon voice recognition software and may include unintentional dictation  errors.    Lavonia Drafts, MD 03/22/16 1141

## 2016-03-22 NOTE — ED Notes (Signed)
Pt assisted to BR.

## 2016-03-22 NOTE — ED Triage Notes (Signed)
Pt to ed with c/o bloody stools since Friday.  Pt states she had colonoscopy on Wednesday and then had large diarrhea and bloody stools on Friday.  Pt states every stool since then has been bloody and pasty.  Pt also reports weakness all over especially worse when having a BM.

## 2016-03-22 NOTE — Consult Note (Signed)
[facility] Consult Note  MRN : [id number]  Wendy Decker is a 76 y.o. (June 28, 1940) female who presents with chief complaint of  Chief Complaint  Patient presents with  . Rectal Bleeding  .  History of Present Illness: I am asked by Dr. Benjie Karvonen to see the patient regarding GI bleeding. The patient underwent a colonoscopy with a right colon polypectomy on 03/17/2016. This was reasonably uncomplicated and the pathology came back as a benign adenomatous polyp although it was reasonably large. She did well initially, but on Friday, 03/19/2016, the patient began to have bright red blood per rectum. This continued for about 24 hours. It was not painful. She had no fevers or chills. The past 48 hours she has basically had old blood and dark stools, but no further bright red blood per rectum. Her hemoglobin came back 10. We are asked to see the patient regarding the possibility of embolization for worsening bleeding and to give our opinion on whether or not embolization would be appropriate at this point.  Current Facility-Administered Medications  Medication Dose Route Frequency Provider Last Rate Last Dose  . 0.9 %  sodium chloride infusion   Intravenous Continuous Bettey Costa, MD 50 mL/hr at 03/22/16 1436    . acetaminophen (TYLENOL) tablet 650 mg  650 mg Oral Q6H PRN Bettey Costa, MD       Or  . acetaminophen (TYLENOL) suppository 650 mg  650 mg Rectal Q6H PRN Bettey Costa, MD      . HYDROcodone-acetaminophen (NORCO/VICODIN) 5-325 MG per tablet 1-2 tablet  1-2 tablet Oral Q4H PRN Bettey Costa, MD      . loratadine (CLARITIN) tablet 10 mg  10 mg Oral Daily Sital Mody, MD      . ondansetron (ZOFRAN) tablet 4 mg  4 mg Oral Q6H PRN Bettey Costa, MD       Or  . ondansetron (ZOFRAN) injection 4 mg  4 mg Intravenous Q6H PRN Sital Mody, MD      . senna-docusate (Senokot-S) tablet 1 tablet  1 tablet Oral QHS PRN Bettey Costa, MD        Past Medical History:  Diagnosis Date  .  Allergic rhinitis   . DDD (degenerative disc disease), cervical   . History of colon polyps   . History of shingles   . Stroke [facility])    HX.OF TIA    Past Surgical History:  Procedure Laterality Date  . APPENDECTOMY    . COLONOSCOPY WITH PROPOFOL N/A 03/17/2016   Procedure: COLONOSCOPY WITH PROPOFOL;  Surgeon: Manya Silvas, MD;  Location: [facility] ENDOSCOPY;  Service: Endoscopy;  Laterality: N/A;  . TUBAL LIGATION      Social History Social History  Substance Use Topics  . Smoking status: Never Smoker  . Smokeless tobacco: Never Used  . Alcohol use No  No IV drug use  Family History Family History  Problem Relation Age of Onset  . Pancreatic cancer Father   . CAD Father   . CAD Mother   . Breast cancer Sister 14  No clotting or bleeding disorders  No Known Allergies   REVIEW OF SYSTEMS (Negative unless checked)  Constitutional: [] Weight loss  [] Fever  [] Chills Cardiac: [] Chest pain   [] Chest pressure   [] Palpitations   [] Shortness of breath when laying flat   [] Shortness of breath at rest   [] Shortness of breath with exertion. Vascular:  [] Pain in legs with walking   [] Pain in legs at rest   []   Pain in legs when laying flat   [] Claudication   [] Pain in feet when walking  [] Pain in feet at rest  [] Pain in feet when laying flat   [] History of DVT   [] Phlebitis   [] Swelling in legs   [] Varicose veins   [] Non-healing ulcers Pulmonary:   [] Uses home oxygen   [] Productive cough   [] Hemoptysis   [] Wheeze  [] COPD   [] Asthma Neurologic:  [] Dizziness  [] Blackouts   [] Seizures   [x] History of stroke   [x] History of TIA  [] Aphasia   [] Temporary blindness   [] Dysphagia   [] Weakness or numbness in arms   [] Weakness or numbness in legs Musculoskeletal:  [] Arthritis   [] Joint swelling   [] Joint pain   [] Low back pain Hematologic:  [] Easy bruising  [] Easy bleeding   [] Hypercoagulable state   [] Anemic  [] Hepatitis Gastrointestinal:  [x] Blood in stool   [] Vomiting blood  [] Gastroesophageal  reflux/heartburn   [] Difficulty swallowing. Genitourinary:  [] Chronic kidney disease   [] Difficult urination  [] Frequent urination  [] Burning with urination   [] Blood in urine Skin:  [] Rashes   [] Ulcers   [] Wounds Psychological:  [] History of anxiety   []  History of major depression.  Physical Examination  Vitals:   03/22/16 0942 03/22/16 1244 03/22/16 1259 03/22/16 1414  BP:  128/78 128/78 130/62  Pulse:  82 90 85  Resp:  18  18  Temp:    98.8 F (37.1 C)  TempSrc:    Oral  SpO2:  100% 100% 99%  Weight: 49.9 kg (110 lb)      Body mass index is 20.78 kg/m. Gen:  WD/WN, NAD. Appears younger than stated age Head: [place]/AT, No temporalis wasting.  Ear/Nose/Throat: Hearing grossly intact, nares w/o erythema or drainage, oropharynx w/o Erythema/Exudate Eyes: Sclera non-icteric, conjunctiva clear Neck: Trachea midline.  No JVD.  Pulmonary:  Good air movement, respirations not labored, equal bilaterally.  Cardiac: RRR, normal S1, S2. Vascular:  Vessel Right Left  Radial Palpable Palpable                                   Gastrointestinal: soft, non-tender/non-distended. No guarding/reflex.  Musculoskeletal: M/S 5/5 throughout.  Extremities without ischemic changes.  No deformity or atrophy.  Neurologic: Sensation grossly intact in extremities.  Symmetrical.  Speech is fluent. Motor exam as listed above. Psychiatric: Judgment intact, Mood & affect appropriate for pt's clinical situation. Dermatologic: No rashes or ulcers noted.  No cellulitis or open wounds. Lymph : No Cervical, Axillary, or Inguinal lymphadenopathy.      CBC Lab Results  Component Value Date   WBC 4.8 03/22/2016   HGB 10.4 (L) 03/22/2016   HCT 28.8 (L) 03/22/2016   MCV 81.4 03/22/2016   PLT 286 03/22/2016    BMET    Component Value Date/Time   NA 131 (L) 03/22/2016 0944   K 4.0 03/22/2016 0944   CL 100 (L) 03/22/2016 0944   CO2 25 03/22/2016 0944   GLUCOSE 107 (H) 03/22/2016 0944   BUN 6  03/22/2016 0944   CREATININE 0.78 03/22/2016 0944   CALCIUM 9.0 03/22/2016 0944   GFRNONAA >60 03/22/2016 0944   GFRAA >60 03/22/2016 0944   Estimated Creatinine Clearance: 45.8 mL/min (by C-G formula based on SCr of 0.78 mg/dL).  COAG Lab Results  Component Value Date   INR 0.94 07/16/2015    Radiology No results found.    Assessment/Plan 1. GI bleed. The patient has  bleeding from her polypectomy site 5 days ago. The bleeding was mostly 3 days ago and she seems to be passing old blood now. I have discussed this case with both Dr. Vira Agar as well as Dr. Benjie Karvonen.  At this time, I do not think there is much role for embolization as I do not think she has active bleeding. If she has recurrent bleeding, I do believe that embolization would be a reasonable option and the source already being expected to be from the right colon, should be amenable to embolization. This would be far less invasive than open surgical therapy. No other vascular recommendations at this point. Please call back with any further bleeding 2. Anemia. Mild. Likely from acute blood loss. No role for transfusion at this point. 3. History of stroke/TIA. No significant residual symptoms. Had a carotid duplex performed about 8 months ago which demonstrated minimal carotid disease bilaterally. This was unlikely to be the cause of her symptoms. This could be checked every 2-3 years with duplex and follow-up.   Leotis Pain, MD  03/22/2016 2:40 PM    This note was created with Dragon medical transcription system.  Any error is purely unintentional

## 2016-03-23 LAB — CBC
HCT: 25.7 % — ABNORMAL LOW (ref 35.0–47.0)
Hemoglobin: 9.3 g/dL — ABNORMAL LOW (ref 12.0–16.0)
MCH: 29.5 pg (ref 26.0–34.0)
MCHC: 36.1 g/dL — ABNORMAL HIGH (ref 32.0–36.0)
MCV: 81.7 fL (ref 80.0–100.0)
PLATELETS: 247 10*3/uL (ref 150–440)
RBC: 3.15 MIL/uL — AB (ref 3.80–5.20)
RDW: 14.1 % (ref 11.5–14.5)
WBC: 4.7 10*3/uL (ref 3.6–11.0)

## 2016-03-23 LAB — BASIC METABOLIC PANEL
ANION GAP: 5 (ref 5–15)
BUN: 6 mg/dL (ref 6–20)
CHLORIDE: 103 mmol/L (ref 101–111)
CO2: 23 mmol/L (ref 22–32)
CREATININE: 0.56 mg/dL (ref 0.44–1.00)
Calcium: 8.2 mg/dL — ABNORMAL LOW (ref 8.9–10.3)
GFR calc non Af Amer: >60 mL/min (ref >60)
Glucose, Bld: 89 mg/dL (ref 65–99)
Potassium: 3.8 mmol/L (ref 3.5–5.1)
SODIUM: 131 mmol/L — AB (ref 135–145)

## 2016-03-23 LAB — HEMOGLOBIN: HEMOGLOBIN: 9.5 g/dL — AB (ref 12.0–16.0)

## 2016-03-23 MED ORDER — SODIUM CHLORIDE 0.9 % IV BOLUS (SEPSIS)
500.0000 mL | Freq: Once | INTRAVENOUS | Status: AC
  Administered 2016-03-23: 500 mL via INTRAVENOUS

## 2016-03-23 MED ORDER — POTASSIUM CHLORIDE IN NACL 20-0.9 MEQ/L-% IV SOLN
INTRAVENOUS | Status: DC
Start: 2016-03-23 — End: 2016-03-24
  Administered 2016-03-23: 11:00:00 via INTRAVENOUS
  Filled 2016-03-23 (×5): qty 1000

## 2016-03-23 NOTE — Progress Notes (Signed)
Patient with minimal complaints at this time. Small dark stool, patient stated better than previous days. Possible discharge 3/21. Monitoring in place.

## 2016-03-23 NOTE — Consult Note (Signed)
Patient had one dark formed stool.  No abd pain, no nausea or vomiting.  No dizzyness when got out of bed and used the bathroom.  Hgb down a little, likely dilution due to iv fluids.  Exam showed no rales, heart 1/6 SEM, abd flat and non tender, no masses.  I think she can go home tomorrow and hold 81mg  ASA for 3-4 days, very soft diet.  Follow up with my office on Monday or Tuesday next week.  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

## 2016-03-23 NOTE — Progress Notes (Signed)
[facility] [facility] [place] NAME: Wendy Decker    MR#:  [id number]  DATE OF BIRTH:  February 22, 1940  SUBJECTIVE:  CHIEF COMPLAINT:   Chief Complaint  Patient presents with  . Rectal Bleeding  The patient is a 76 year old Caucasian female with past medical history significant for history of recent colonoscopy, status post colon polyp removal, who presented to the hospital with complaints of rectal bleeding. Initially red blood, which turned maroon color. She denied any abdominal pain. Patient was admitted to the hospital for further evaluation, treatment, consulted by gastroenterologist, vascular surgeon, who recommended close follow-up in the hospital, hemoglobin follow-up. Patient denies any more bleeding. Hemoglobin level is stable. Patient was to go home.  Review of Systems  Constitutional: Negative for chills, fever and weight loss.  HENT: Negative for congestion.   Eyes: Negative for blurred vision and double vision.  Respiratory: Negative for cough, sputum production, shortness of breath and wheezing.   Cardiovascular: Negative for chest pain, palpitations, orthopnea, leg swelling and PND.  Gastrointestinal: Positive for blood in stool. Negative for abdominal pain, constipation, diarrhea, nausea and vomiting.  Genitourinary: Negative for dysuria, frequency, hematuria and urgency.  Musculoskeletal: Negative for falls.  Neurological: Negative for dizziness, tremors, focal weakness and headaches.  Endo/Heme/Allergies: Does not bruise/bleed easily.  Psychiatric/Behavioral: Negative for depression. The patient does not have insomnia.     VITAL SIGNS: Blood pressure 111/60, pulse 79, temperature 97.9 F (36.6 C), temperature source Oral, resp. rate 16, height 5\' 1"  (1.549 m), weight 48.9 kg (107 lb 12.9 oz), SpO2 100 %.  PHYSICAL EXAMINATION:   GENERAL:  76 y.o.-year-old patient lying in the bed with no acute distress.  EYES: Pupils equal,  round, reactive to light and accommodation. No scleral icterus. Extraocular muscles intact.  HEENT: Head atraumatic, normocephalic. Oropharynx and nasopharynx clear.  NECK:  Supple, no jugular venous distention. No thyroid enlargement, no tenderness.  LUNGS: Normal breath sounds bilaterally, no wheezing, rales,rhonchi or crepitation. No use of accessory muscles of respiration.  CARDIOVASCULAR: S1, S2 normal. No murmurs, rubs, or gallops.  ABDOMEN: Soft, nontender, nondistended. Bowel sounds present. No organomegaly or mass.  EXTREMITIES: No pedal edema, cyanosis, or clubbing.  NEUROLOGIC: Cranial nerves II through XII are intact. Muscle strength 5/5 in all extremities. Sensation intact. Gait not checked.  PSYCHIATRIC: The patient is alert and oriented x 3.  SKIN: No obvious rash, lesion, or ulcer.   ORDERS/RESULTS REVIEWED:   CBC  Recent Labs Lab 03/22/16 0944 03/22/16 1416 03/22/16 2209 03/23/16 0305  WBC 4.8  --   --  4.7  HGB 10.0* 10.4* 9.5* 9.3*  HCT 28.8*  --   --  25.7*  PLT 286  --   --  247  MCV 81.4  --   --  81.7  MCH 28.2  --   --  29.5  MCHC 34.7  --   --  36.1*  RDW 14.3  --   --  14.1   ------------------------------------------------------------------------------------------------------------------  Chemistries   Recent Labs Lab 03/22/16 0944 03/23/16 0305  NA 131* 131*  K 4.0 3.8  CL 100* 103  CO2 25 23  GLUCOSE 107* 89  BUN 6 6  CREATININE 0.78 0.56  CALCIUM 9.0 8.2*  AST 20  --   ALT 11*  --   ALKPHOS 72  --   BILITOT 0.4  --    ------------------------------------------------------------------------------------------------------------------ estimated creatinine clearance is 45.8 mL/min (by C-G formula based on SCr of 0.56  mg/dL). ------------------------------------------------------------------------------------------------------------------ No results for input(s): TSH, T4TOTAL, T3FREE, THYROIDAB in the last 72 hours.  Invalid input(s):  FREET3  Cardiac Enzymes No results for input(s): CKMB, TROPONINI, MYOGLOBIN in the last 168 hours.  Invalid input(s): CK ------------------------------------------------------------------------------------------------------------------ Invalid input(s): POCBNP ---------------------------------------------------------------------------------------------------------------  RADIOLOGY: No results found.  EKG:  Orders placed or performed during the hospital encounter of 07/16/15  . ED EKG  . ED EKG  . EKG 12-Lead  . EKG 12-Lead    ASSESSMENT AND PLAN:  Active Problems:   Rectal bleeding   #1. Lower GI bleed due to recent polypectomy,  the patient continues to have small amount of dark blood in stool, but not actively bleeding, appreciate gastroenterologist  input, continue diet, possible discharge tonight or tomorrow #2. Acute posthemorrhagic anemia, no need to transfuse, hemoglobin level is stable. No active bleeding. Patient was seen by vascular surgeon as recommended no embolization  #3. Hyponatremia, continue IV fluids, follow sodium level in the morning #4. Hyperglycemia, resolved  Management plans discussed with the patient, family and they are in agreement.   DRUG ALLERGIES: No Known Allergies  CODE STATUS:     Code Status Orders        Start     Ordered   03/22/16 1410  Full code  Continuous     03/22/16 1409    Code Status History    Date Active Date Inactive Code Status Order ID Comments User Context   07/16/2015 12:53 PM 07/17/2015  6:28 PM Full Code [id number]  Loletha Grayer, MD ED      TOTAL TIME TAKING CARE OF THIS PATIENT: . 40 minutes.    Theodoro Grist M.D on 03/23/2016 at 3:52 PM  Between 7am to 6pm - Pager - [phone]  After 6pm go to [email or web address] - password [email or web address] [facility]  Office  [phone]  CC: Primary care physician; Dion Body, MD

## 2016-03-23 NOTE — Progress Notes (Signed)
While rounding the unit the Chaplain visited with the Pt. Pt was lying on bed with her family bedside. [place] spoke with the Pt. Pt stated she was feeling much better and was anticipating to discharge soon. Pt appeared to be in good spirit and had a good support system consisting of his family. [place] informed the Pt the Chaplain services are available if needed.    03/23/16 1600  Clinical Encounter Type  Visited With Patient;Patient and family together  Visit Type Initial;Spiritual support  Referral From [place];Other (Comment)

## 2016-03-24 DIAGNOSIS — R739 Hyperglycemia, unspecified: Secondary | ICD-10-CM

## 2016-03-24 DIAGNOSIS — D62 Acute posthemorrhagic anemia: Secondary | ICD-10-CM

## 2016-03-24 DIAGNOSIS — E860 Dehydration: Secondary | ICD-10-CM

## 2016-03-24 DIAGNOSIS — R531 Weakness: Secondary | ICD-10-CM

## 2016-03-24 LAB — HEMOGLOBIN: Hemoglobin: 8.3 g/dL — ABNORMAL LOW (ref 12.0–16.0)

## 2016-03-24 LAB — SODIUM: Sodium: 137 mmol/L (ref 135–145)

## 2016-03-24 NOTE — Progress Notes (Signed)
        To Whom It May Concern        Mrs. Wendy Decker was hospitalized at [facility] from 03/22/2016 through 03/24/2016. She is excused from work for medical reasons through 03/29/2016. She is to return back to her duties according to her primary care physician's and/or gastroenterologist recommendations. Thank you for your understanding.      Sincerely,  Theodoro Grist, MD

## 2016-03-24 NOTE — Discharge Summary (Signed)
[place] [facility] [place] NAME: Wendy Decker    MR#:  [id number]  DATE OF BIRTH:  [birth year]  DATE OF ADMISSION:  03/22/2016 ADMITTING PHYSICIAN: Bettey Costa, MD  DATE OF DISCHARGE: 03/24/2016 12:33 PM  PRIMARY CARE PHYSICIAN: Dion Body, MD     ADMISSION DIAGNOSIS:  Rectal bleeding [K62.5] Status post colonoscopy with polypectomy [Z98.890]  DISCHARGE DIAGNOSIS:  Principal Problem:   Rectal bleeding Active Problems:   Acute posthemorrhagic anemia   Dehydration   Hyponatremia   Hyperglycemia   Generalized weakness   SECONDARY DIAGNOSIS:   Past Medical History:  Diagnosis Date  . Allergic rhinitis   . DDD (degenerative disc disease), cervical   . History of colon polyps   . History of shingles   . Stroke ([place])    HX.OF TIA    .pro HOSPITAL COURSE:   The patient is a 76 year old Caucasian female with past medical history significant for history of recent colonoscopy, status post colon polyp removal, who presented to the hospital with complaints of rectal bleeding. Initially red blood, which turned maroon color. She denied any abdominal pain. Patient was admitted to the hospital for further evaluation, treatment, consulted by gastroenterologist, vascular surgeon, who recommended close follow-up in the hospital, hemoglobin follow-up. Patient had no more bleeding. Hemoglobin level remained relatively stable. Patient was felt to be stable to go home Discussion by problem: #1. Lower GI bleed due to recent polypectomy,   the bleeding has stopped. Holding aspirin therapy, patient is to continue hold aspirin for least one week or until restarted by primary care physician or gastroenterologist, appreciate gastroenterologist, vascular surgeon  inputs, continue soft diet, discharge today with follow up with gastroenterologist in about 1 week, discussed with Dr. Vira Agar today #2. Acute posthemorrhagic anemia, no need to transfuse,  hemoglobin level is relatively stable. No active bleeding. Patient was seen by vascular surgeon as recommended no embolization  #3. Hyponatremia, resolved on IV fluids, likely dehydration related during colonoscopy prep  #4. Hyperglycemia, resolved DISCHARGE CONDITIONS:   Stable  CONSULTS OBTAINED:  Treatment Team:  Algernon Huxley, MD  DRUG ALLERGIES:  No Known Allergies  DISCHARGE MEDICATIONS:   Discharge Medication List as of 03/24/2016 11:47 AM    CONTINUE these medications which have NOT CHANGED   Details  atorvastatin (LIPITOR) 40 MG tablet Take 1 tablet (40 mg total) by mouth daily at 6 PM., Starting Thu 07/17/2015, Normal    cetirizine (ZYRTEC) 10 MG tablet Take 10 mg by mouth daily as needed for allergies., Until Discontinued, Historical Med      STOP taking these medications     aspirin EC 325 MG tablet      aspirin EC 81 MG tablet          DISCHARGE INSTRUCTIONS:    The patient is to follow-up with primary care physician and Dr. Vira Agar in about one week after discharge, follow hemoglobin level as outpatient  If you experience worsening of your admission symptoms, develop shortness of breath, life threatening emergency, suicidal or homicidal thoughts you must seek medical attention immediately by calling 911 or calling your MD immediately  if symptoms less severe.  You Must read complete instructions/literature along with all the possible adverse reactions/side effects for all the Medicines you take and that have been prescribed to you. Take any new Medicines after you have completely understood and accept all the possible adverse reactions/side effects.   Please note  You were cared for  by a hospitalist during your hospital stay. If you have any questions about your discharge medications or the care you received while you were in the hospital after you are discharged, you can call the unit and asked to speak with the hospitalist on call if the hospitalist that took  care of you is not available. Once you are discharged, your primary care physician will handle any further medical issues. Please note that NO REFILLS for any discharge medications will be authorized once you are discharged, as it is imperative that you return to your primary care physician (or establish a relationship with a primary care physician if you do not have one) for your aftercare needs so that they can reassess your need for medications and monitor your lab values.    Today   CHIEF COMPLAINT:   Chief Complaint  Patient presents with  . Rectal Bleeding    HISTORY OF PRESENT ILLNESS:  Wendy Decker  is a 76 y.o. female with a known history of recent colonoscopy, status post colon polyp removal, who presented to the hospital with complaints of rectal bleeding. Initially red blood, which turned maroon color. She denied any abdominal pain. Patient was admitted to the hospital for further evaluation, treatment, consulted by gastroenterologist, vascular surgeon, who recommended close follow-up in the hospital, hemoglobin follow-up. Patient had no more bleeding. Hemoglobin level remained relatively stable. Patient was felt to be stable to go home Discussion by problem: #1. Lower GI bleed due to recent polypectomy,   the bleeding has stopped. Holding aspirin therapy, patient is to continue hold aspirin for least one week or until restarted by primary care physician or gastroenterologist, appreciate gastroenterologist, vascular surgeon  inputs, continue soft diet, discharge today with follow up with gastroenterologist in about 1 week, discussed with Dr. Vira Agar today #2. Acute posthemorrhagic anemia, no need to transfuse, hemoglobin level is relatively stable. No active bleeding. Patient was seen by vascular surgeon as recommended no embolization  #3. Hyponatremia, resolved on IV fluids, likely dehydration related during colonoscopy prep  #4. Hyperglycemia, resolved   VITAL SIGNS:  Blood  pressure 117/61, pulse 82, temperature 98.4 F (36.9 C), temperature source Oral, resp. rate 20, height 5\' 1"  (1.549 m), weight 48.9 kg (107 lb 12.9 oz), SpO2 98 %.  I/O:   Intake/Output Summary (Last 24 hours) at 03/24/16 1739 Last data filed at 03/24/16 0800  Gross per 24 hour  Intake          2831.66 ml  Output                0 ml  Net          2831.66 ml    PHYSICAL EXAMINATION:  GENERAL:  76 y.o.-year-old patient lying in the bed with no acute distress.  EYES: Pupils equal, round, reactive to light and accommodation. No scleral icterus. Extraocular muscles intact.  HEENT: Head atraumatic, normocephalic. Oropharynx and nasopharynx clear.  NECK:  Supple, no jugular venous distention. No thyroid enlargement, no tenderness.  LUNGS: Normal breath sounds bilaterally, no wheezing, rales,rhonchi or crepitation. No use of accessory muscles of respiration.  CARDIOVASCULAR: S1, S2 normal. No murmurs, rubs, or gallops.  ABDOMEN: Soft, non-tender, non-distended. Bowel sounds present. No organomegaly or mass.  EXTREMITIES: No pedal edema, cyanosis, or clubbing.  NEUROLOGIC: Cranial nerves II through XII are intact. Muscle strength 5/5 in all extremities. Sensation intact. Gait not checked.  PSYCHIATRIC: The patient is alert and oriented x 3.  SKIN: No obvious rash, lesion, or ulcer.  DATA REVIEW:   CBC  Recent Labs Lab 03/23/16 0305  03/24/16 0323  WBC 4.7  --   --   HGB 9.3*  < > 8.3*  HCT 25.7*  --   --   PLT 247  --   --   < > = values in this interval not displayed.  Chemistries   Recent Labs Lab 03/22/16 0944 03/23/16 0305 03/24/16 0323  NA 131* 131* 137  K 4.0 3.8  --   CL 100* 103  --   CO2 25 23  --   GLUCOSE 107* 89  --   BUN 6 6  --   CREATININE 0.78 0.56  --   CALCIUM 9.0 8.2*  --   AST 20  --   --   ALT 11*  --   --   ALKPHOS 72  --   --   BILITOT 0.4  --   --     Cardiac Enzymes No results for input(s): TROPONINI in the last 168  hours.  Microbiology Results  No results found for this or any previous visit.  RADIOLOGY:  No results found.  EKG:   Orders placed or performed during the hospital encounter of 07/16/15  . ED EKG  . ED EKG  . EKG 12-Lead  . EKG 12-Lead      Management plans discussed with the patient, family and they are in agreement.  CODE STATUS:  Code Status History    Date Active Date Inactive Code Status Order ID Comments User Context   03/22/2016  2:09 PM 03/24/2016  3:38 PM Full Code [id number]  Bettey Costa, MD Inpatient   07/16/2015 12:53 PM 07/17/2015  6:28 PM Full Code [id number]  Loletha Grayer, MD ED      TOTAL TIME TAKING CARE OF THIS PATIENT: 40 minutes.    Theodoro Grist M.D on 03/24/2016 at 5:39 PM  Between 7am to 6pm - Pager - [phone]  After 6pm go to [email or web address] - password [email or web address] [facility]  Office  [phone]  CC: Primary care physician; Dion Body, MD

## 2016-03-24 NOTE — Progress Notes (Signed)
Pt being discharged home today. PIV removed. Discharge instructions reviewed with pt, all questions answered. No new prescriptions needed at discharge, she will follow up with GI next week, appointment has been made. She is leaving with all her belongings, will be transported home via family.

## 2016-08-23 ENCOUNTER — Other Ambulatory Visit: Payer: Self-pay | Admitting: Family Medicine

## 2016-08-23 DIAGNOSIS — Z1231 Encounter for screening mammogram for malignant neoplasm of breast: Secondary | ICD-10-CM

## 2016-10-08 ENCOUNTER — Ambulatory Visit
Admission: RE | Admit: 2016-10-08 | Discharge: 2016-10-08 | Disposition: A | Discharge status: Home / Self Care | Payer: BLUE CROSS/BLUE SHIELD | Source: Ambulatory Visit | Attending: Family Medicine | Admitting: Family Medicine

## 2016-10-08 DIAGNOSIS — Z1231 Encounter for screening mammogram for malignant neoplasm of breast: Secondary | ICD-10-CM | POA: Diagnosis not present

## 2016-10-12 ENCOUNTER — Encounter: Payer: Self-pay | Admitting: *Deleted

## 2016-10-13 ENCOUNTER — Ambulatory Visit
Admission: RE | Admit: 2016-10-13 | Discharge: 2016-10-13 | Disposition: A | Discharge status: Home / Self Care | Payer: BLUE CROSS/BLUE SHIELD | Source: Ambulatory Visit | Attending: Unknown Physician Specialty | Admitting: Unknown Physician Specialty

## 2016-10-13 ENCOUNTER — Encounter
Admission: RE | Disposition: A | Discharge status: Home / Self Care | Payer: Self-pay | Source: Ambulatory Visit | Attending: Unknown Physician Specialty

## 2016-10-13 ENCOUNTER — Ambulatory Visit: Payer: BLUE CROSS/BLUE SHIELD | Admitting: Anesthesiology

## 2016-10-13 DIAGNOSIS — D120 Benign neoplasm of cecum: Secondary | ICD-10-CM | POA: Diagnosis not present

## 2016-10-13 DIAGNOSIS — Z1211 Encounter for screening for malignant neoplasm of colon: Secondary | ICD-10-CM | POA: Diagnosis present

## 2016-10-13 DIAGNOSIS — Z79899 Other long term (current) drug therapy: Secondary | ICD-10-CM | POA: Diagnosis not present

## 2016-10-13 DIAGNOSIS — Z8673 Personal history of transient ischemic attack (TIA), and cerebral infarction without residual deficits: Secondary | ICD-10-CM | POA: Insufficient documentation

## 2016-10-13 DIAGNOSIS — Z86010 Personal history of colonic polyps: Secondary | ICD-10-CM | POA: Diagnosis not present

## 2016-10-13 DIAGNOSIS — Z8249 Family history of ischemic heart disease and other diseases of the circulatory system: Secondary | ICD-10-CM | POA: Diagnosis not present

## 2016-10-13 DIAGNOSIS — K5730 Diverticulosis of large intestine without perforation or abscess without bleeding: Secondary | ICD-10-CM | POA: Insufficient documentation

## 2016-10-13 DIAGNOSIS — Z7982 Long term (current) use of aspirin: Secondary | ICD-10-CM | POA: Diagnosis not present

## 2016-10-13 DIAGNOSIS — K640 First degree hemorrhoids: Secondary | ICD-10-CM | POA: Diagnosis not present

## 2016-10-13 DIAGNOSIS — Z8619 Personal history of other infectious and parasitic diseases: Secondary | ICD-10-CM | POA: Diagnosis not present

## 2016-10-13 HISTORY — PX: COLONOSCOPY: SHX5424

## 2016-10-13 HISTORY — DX: Transient cerebral ischemic attack, unspecified: G45.9

## 2016-10-13 SURGERY — COLONOSCOPY
Anesthesia: General

## 2016-10-13 MED ORDER — SODIUM CHLORIDE 0.9 % IV SOLN: INTRAVENOUS | Status: DC

## 2016-10-13 MED ORDER — LIDOCAINE 2% (20 MG/ML) 5 ML SYRINGE
INTRAMUSCULAR | Status: DC | PRN
  Administered 2016-10-13: 30 mg via INTRAVENOUS

## 2016-10-13 MED ORDER — LIDOCAINE HCL (PF) 1 % IJ SOLN
2.0000 mL | Freq: Once | INTRAMUSCULAR | Status: AC
  Administered 2016-10-13: 0.3 mL via INTRADERMAL

## 2016-10-13 MED ORDER — LIDOCAINE HCL (PF) 1 % IJ SOLN
INTRAMUSCULAR | Status: AC
  Administered 2016-10-13: 0.3 mL via INTRADERMAL
  Filled 2016-10-13: qty 2

## 2016-10-13 MED ORDER — FENTANYL CITRATE (PF) 100 MCG/2ML IJ SOLN
INTRAMUSCULAR | Status: AC
  Filled 2016-10-13: qty 2

## 2016-10-13 MED ORDER — PROPOFOL 10 MG/ML IV BOLUS
INTRAVENOUS | Status: DC | PRN
  Administered 2016-10-13: 100 mg via INTRAVENOUS

## 2016-10-13 MED ORDER — SODIUM CHLORIDE 0.9 % IV SOLN
INTRAVENOUS | Status: DC
  Administered 2016-10-13: 13:00:00 via INTRAVENOUS

## 2016-10-13 MED ORDER — PROPOFOL 500 MG/50ML IV EMUL
INTRAVENOUS | Status: AC
  Filled 2016-10-13: qty 50

## 2016-10-13 MED ORDER — PROPOFOL 500 MG/50ML IV EMUL
INTRAVENOUS | Status: DC | PRN
  Administered 2016-10-13: 140 ug/kg/min via INTRAVENOUS

## 2016-10-13 MED ORDER — FENTANYL CITRATE (PF) 100 MCG/2ML IJ SOLN
INTRAMUSCULAR | Status: DC | PRN
  Administered 2016-10-13 (×2): 50 ug via INTRAVENOUS

## 2016-10-13 MED ORDER — PHENYLEPHRINE HCL 10 MG/ML IJ SOLN
INTRAMUSCULAR | Status: DC | PRN
  Administered 2016-10-13: 100 ug via INTRAVENOUS

## 2016-10-13 NOTE — Anesthesia Preprocedure Evaluation (Signed)
Anesthesia Evaluation  Patient identified by MRN, date of birth, ID band Patient awake    Reviewed: Allergy & Precautions, NPO status , Patient's Chart, lab work & pertinent test results  History of Anesthesia Complications Negative for: history of anesthetic complications  Airway Mallampati: II       Dental   Pulmonary neg sleep apnea, neg COPD,           Cardiovascular (-) hypertension(-) Past MI and (-) CHF (-) dysrhythmias (-) Valvular Problems/Murmurs     Neuro/Psych TIA (leg weakness, resolved within 30 minutes)   GI/Hepatic Neg liver ROS, neg GERD  ,  Endo/Other  neg diabetes  Renal/GU negative Renal ROS     Musculoskeletal   Abdominal   Peds  Hematology  (+) anemia ,   Anesthesia Other Findings   Reproductive/Obstetrics                             Anesthesia Physical Anesthesia Plan  ASA: III  Anesthesia Plan: General   Post-op Pain Management:    Induction: Intravenous  PONV Risk Score and Plan: Propofol infusion  Airway Management Planned:   Additional Equipment:   Intra-op Plan:   Post-operative Plan:   Informed Consent: I have reviewed the patients History and Physical, chart, labs and discussed the procedure including the risks, benefits and alternatives for the proposed anesthesia with the patient or authorized representative who has indicated his/her understanding and acceptance.     Plan Discussed with:   Anesthesia Plan Comments:         Anesthesia Quick Evaluation

## 2016-10-13 NOTE — Anesthesia Post-op Follow-up Note (Signed)
Anesthesia QCDR form completed.        

## 2016-10-13 NOTE — Op Note (Signed)
[facility] Patient Name: Wendy Decker Procedure Date: 10/13/2016 12:57 PM MRN: [id number] Account #: [id number] Date of Birth: 1940-07-30 Admit Type: Outpatient Age: 76 Room: [facility] ENDO ROOM 3 Gender: Female Note Status: Finalized Procedure:            Colonoscopy Indications:          High risk colon cancer surveillance: Personal history                        of colonic polyps Providers:            Manya Silvas, MD Medicines:            Propofol per Anesthesia Complications:        No immediate complications. Procedure:            Pre-Anesthesia Assessment:                       - After reviewing the risks and benefits, the patient                        was deemed in satisfactory condition to undergo the                        procedure.                       After obtaining informed consent, the colonoscope was                        passed under direct vision. Throughout the procedure,                        the patient's blood pressure, pulse, and oxygen                        saturations were monitored continuously. The                        Colonoscope was introduced through the anus and                        advanced to the the cecum, identified by appendiceal                        orifice and ileocecal valve. The colonoscopy was                        performed without difficulty. The patient tolerated the                        procedure well. The quality of the bowel preparation                        was good. Findings:      A small polyp was found in the cecum. The polyp was sessile. It was       flat. The polyp was removed with a jumbo cold forceps. This was followed       by argon treatment with settings for right colon. Remaining tissue       destroyed.      Multiple small-mouthed diverticula  were found in the sigmoid colon and       descending colon.      Internal hemorrhoids were found during endoscopy. The hemorrhoids  were       small and Grade I (internal hemorrhoids that do not prolapse). Impression:           - One small polyp in the cecum, removed with a jumbo                        cold forceps. Resected and retrieved.                       - Diverticulosis in the sigmoid colon and in the                        descending colon.                       - Internal hemorrhoids. Recommendation:       - Await pathology results. Manya Silvas, MD 10/13/2016 1:38:29 PM This report has been signed electronically. Number of Addenda: 0 Note Initiated On: 10/13/2016 12:57 PM Scope Withdrawal Time: 0 hours 19 minutes 51 seconds  Total Procedure Duration: 0 hours 28 minutes 25 seconds       [facility]

## 2016-10-13 NOTE — Transfer of Care (Signed)
Immediate Anesthesia Transfer of Care Note  Patient: Wendy Decker  Procedure(s) Performed: COLONOSCOPY (N/A )  Patient Location: PACU and Endoscopy Unit  Anesthesia Type:General  Level of Consciousness: awake and patient cooperative  Airway & Oxygen Therapy: Patient Spontanous Breathing and Patient connected to nasal cannula oxygen  Post-op Assessment: Report given to RN and Post -op Vital signs reviewed and stable  Post vital signs: Reviewed and stable  Last Vitals:  Vitals:   10/13/16 1220  BP: (!) 165/69  Pulse: (!) 17  Resp: 17  Temp: (!) 36 C  SpO2: 100%    Last Pain:  Vitals:   10/13/16 1220  TempSrc: Tympanic         Complications: No apparent anesthesia complications

## 2016-10-13 NOTE — Anesthesia Postprocedure Evaluation (Signed)
Anesthesia Post Note  Patient: Wendy Decker  Procedure(s) Performed: COLONOSCOPY (N/A )  Patient location during evaluation: Endoscopy Anesthesia Type: General Level of consciousness: awake and alert Pain management: pain level controlled Vital Signs Assessment: post-procedure vital signs reviewed and stable Respiratory status: spontaneous breathing and respiratory function stable Cardiovascular status: stable Anesthetic complications: no     Last Vitals:  Vitals:   10/13/16 1345 10/13/16 1350  BP: 110/61 123/64  Pulse: 79   Resp: 13   Temp:    SpO2: 100%     Last Pain:  Vitals:   10/13/16 1340  TempSrc: Tympanic                 KEPHART,WILLIAM K

## 2016-10-13 NOTE — H&P (Signed)
   Primary Care Physician:  Dion Body, MD Primary Gastroenterologist:  Dr. Vira Agar  Pre-Procedure History & Physical: HPI:  Wendy Decker is a 76 y.o. female is here for an colonoscopy.   Past Medical History:  Diagnosis Date  . Allergic rhinitis   . DDD (degenerative disc disease), cervical   . History of colon polyps   . History of shingles   . History of shingles   . Stroke ([place])    HX.OF TIA  . TIA (transient ischemic attack)     Past Surgical History:  Procedure Laterality Date  . APPENDECTOMY    . COLONOSCOPY WITH PROPOFOL N/A 03/17/2016   Procedure: COLONOSCOPY WITH PROPOFOL;  Surgeon: Manya Silvas, MD;  Location: [facility] ENDOSCOPY;  Service: Endoscopy;  Laterality: N/A;  . TUBAL LIGATION      Prior to Admission medications   Medication Sig Start Date End Date Taking? Authorizing Provider  aspirin EC 81 MG tablet Take 81 mg by mouth daily.   Yes [provider]  atorvastatin (LIPITOR) 40 MG tablet Take 1 tablet (40 mg total) by mouth daily at 6 PM. Patient not taking: Reported on 03/22/2016 07/17/15   Theodoro Grist, MD  cetirizine (ZYRTEC) 10 MG tablet Take 10 mg by mouth daily as needed for allergies.    [provider]    Allergies as of 09/27/2016  . (No Known Allergies)    Family History  Problem Relation Age of Onset  . Pancreatic cancer Father   . CAD Father   . CAD Mother   . Breast cancer Sister 59    Social History   Social History  . Marital status: Married    Spouse name: N/A  . Number of children: N/A  . Years of education: N/A   Occupational History  . Not on file.   Social History Main Topics  . Smoking status: Never Smoker  . Smokeless tobacco: Never Used  . Alcohol use No  . Drug use: No  . Sexual activity: Not on file   Other Topics Concern  . Not on file   Social History Narrative  . No narrative on file    Review of Systems: See HPI, otherwise negative ROS  Physical Exam: BP (!) 165/69    Pulse (!) 17   Temp (!) 96.8 F (36 C) (Tympanic)   Resp 17   Ht 5\' 1"  (1.549 m)   Wt 50.8 kg (112 lb)   SpO2 100%   BMI 21.16 kg/m  General:   Alert,  pleasant and cooperative in NAD Head:  Normocephalic and atraumatic. Neck:  Supple; no masses or thyromegaly. Lungs:  Clear throughout to auscultation.    Heart:  Regular rate and rhythm. Abdomen:  Soft, nontender and nondistended. Normal bowel sounds, without guarding, and without rebound.   Neurologic:  Alert and  oriented x4;  grossly normal neurologically.  Impression/Plan: Wendy Decker is here for an colonoscopy to be performed for follow up of very large polyp removed endoscopic  Risks, benefits, limitations, and alternatives regarding  colonoscopy have been reviewed with the patient.  Questions have been answered.  All parties agreeable.   Gaylyn Cheers, MD  10/13/2016, 1:01 PM

## 2016-10-14 ENCOUNTER — Encounter: Payer: Self-pay | Admitting: Unknown Physician Specialty

## 2016-10-15 LAB — SURGICAL PATHOLOGY

## 2016-10-25 ENCOUNTER — Encounter: Payer: Self-pay | Admitting: *Deleted

## 2016-10-26 ENCOUNTER — Ambulatory Visit: Payer: BLUE CROSS/BLUE SHIELD | Admitting: Anesthesiology

## 2016-10-26 ENCOUNTER — Ambulatory Visit
Admission: RE | Admit: 2016-10-26 | Discharge: 2016-10-26 | Disposition: A | Discharge status: Home / Self Care | Payer: BLUE CROSS/BLUE SHIELD | Source: Ambulatory Visit | Attending: Ophthalmology | Admitting: Ophthalmology

## 2016-10-26 ENCOUNTER — Encounter
Admission: RE | Disposition: A | Discharge status: Home / Self Care | Payer: Self-pay | Source: Ambulatory Visit | Attending: Ophthalmology

## 2016-10-26 ENCOUNTER — Encounter: Payer: Self-pay | Admitting: *Deleted

## 2016-10-26 DIAGNOSIS — H2511 Age-related nuclear cataract, right eye: Secondary | ICD-10-CM | POA: Diagnosis not present

## 2016-10-26 HISTORY — PX: CATARACT EXTRACTION W/PHACO: SHX586

## 2016-10-26 SURGERY — PHACOEMULSIFICATION, CATARACT, WITH IOL INSERTION
Anesthesia: Monitor Anesthesia Care | Site: Eye | Laterality: Right | Wound class: Clean

## 2016-10-26 MED ORDER — NA CHONDROIT SULF-NA HYALURON 40-17 MG/ML IO SOLN
INTRAOCULAR | Status: AC
  Filled 2016-10-26: qty 1

## 2016-10-26 MED ORDER — CARBACHOL 0.01 % IO SOLN
INTRAOCULAR | Status: DC | PRN
  Administered 2016-10-26: .5 mL via INTRAOCULAR

## 2016-10-26 MED ORDER — POVIDONE-IODINE 5 % OP SOLN
OPHTHALMIC | Status: DC | PRN
  Administered 2016-10-26: 1 via OPHTHALMIC

## 2016-10-26 MED ORDER — NA CHONDROIT SULF-NA HYALURON 40-17 MG/ML IO SOLN
INTRAOCULAR | Status: DC | PRN
  Administered 2016-10-26: 1 mL via INTRAOCULAR

## 2016-10-26 MED ORDER — EPINEPHRINE PF 1 MG/ML IJ SOLN
INTRAOCULAR | Status: DC | PRN
  Administered 2016-10-26: 1 mL via OPHTHALMIC

## 2016-10-26 MED ORDER — LIDOCAINE HCL (PF) 4 % IJ SOLN
INTRAMUSCULAR | Status: AC
  Filled 2016-10-26: qty 5

## 2016-10-26 MED ORDER — SODIUM CHLORIDE 0.9 % IV SOLN
INTRAVENOUS | Status: DC
  Administered 2016-10-26 (×2): via INTRAVENOUS

## 2016-10-26 MED ORDER — MOXIFLOXACIN HCL 0.5 % OP SOLN
OPHTHALMIC | Status: DC | PRN
  Administered 2016-10-26: .2 mL via OPHTHALMIC

## 2016-10-26 MED ORDER — LIDOCAINE HCL (PF) 4 % IJ SOLN
INTRAOCULAR | Status: DC | PRN
  Administered 2016-10-26: 2 mL via OPHTHALMIC

## 2016-10-26 MED ORDER — POVIDONE-IODINE 5 % OP SOLN
OPHTHALMIC | Status: AC
  Filled 2016-10-26: qty 30

## 2016-10-26 MED ORDER — ARMC OPHTHALMIC DILATING DROPS
1.0000 "application " | OPHTHALMIC | Status: AC
  Administered 2016-10-26 (×3): 1 via OPHTHALMIC

## 2016-10-26 MED ORDER — MOXIFLOXACIN HCL 0.5 % OP SOLN
OPHTHALMIC | Status: AC
  Filled 2016-10-26: qty 3

## 2016-10-26 MED ORDER — FENTANYL CITRATE (PF) 100 MCG/2ML IJ SOLN: 25.0000 ug | INTRAMUSCULAR | Status: DC | PRN

## 2016-10-26 MED ORDER — MOXIFLOXACIN HCL 0.5 % OP SOLN: 1.0000 [drp] | OPHTHALMIC | Status: DC | PRN

## 2016-10-26 MED ORDER — MIDAZOLAM HCL 2 MG/2ML IJ SOLN
INTRAMUSCULAR | Status: DC | PRN
  Administered 2016-10-26 (×2): 1 mg via INTRAVENOUS

## 2016-10-26 MED ORDER — ARMC OPHTHALMIC DILATING DROPS
OPHTHALMIC | Status: AC
  Administered 2016-10-26: 1 via OPHTHALMIC
  Filled 2016-10-26: qty 0.4

## 2016-10-26 MED ORDER — ONDANSETRON HCL 4 MG/2ML IJ SOLN: 4.0000 mg | Freq: Once | INTRAMUSCULAR | Status: DC | PRN

## 2016-10-26 MED ORDER — MIDAZOLAM HCL 2 MG/2ML IJ SOLN
INTRAMUSCULAR | Status: AC
  Filled 2016-10-26: qty 2

## 2016-10-26 MED ORDER — EPINEPHRINE PF 1 MG/ML IJ SOLN
INTRAMUSCULAR | Status: AC
  Filled 2016-10-26: qty 1

## 2016-10-26 SURGICAL SUPPLY — 16 items
GLOVE BIO SURGEON STRL SZ8 (GLOVE) ×3 IMPLANT
GLOVE BIOGEL M 6.5 STRL (GLOVE) ×3 IMPLANT
GLOVE SURG LX 8.0 MICRO (GLOVE) ×2
GLOVE SURG LX STRL 8.0 MICRO (GLOVE) ×1 IMPLANT
GOWN STRL REUS W/ TWL LRG LVL3 (GOWN DISPOSABLE) ×2 IMPLANT
GOWN STRL REUS W/TWL LRG LVL3 (GOWN DISPOSABLE) ×4
LABEL CATARACT MEDS ST (LABEL) ×3 IMPLANT
LENS IOL TECNIS ITEC 19.0 (Intraocular Lens) ×3 IMPLANT
PACK CATARACT (MISCELLANEOUS) ×3 IMPLANT
PACK CATARACT BRASINGTON LX (MISCELLANEOUS) ×3 IMPLANT
PACK EYE AFTER SURG (MISCELLANEOUS) ×3 IMPLANT
SOL BSS BAG (MISCELLANEOUS) ×3
SOLUTION BSS BAG (MISCELLANEOUS) ×1 IMPLANT
SYR 5ML LL (SYRINGE) ×3 IMPLANT
WATER STERILE IRR 250ML POUR (IV SOLUTION) ×3 IMPLANT
WIPE NON LINTING 3.25X3.25 (MISCELLANEOUS) ×3 IMPLANT

## 2016-10-26 NOTE — Op Note (Signed)
PREOPERATIVE DIAGNOSIS:  Nuclear sclerotic cataract of the right eye.   POSTOPERATIVE DIAGNOSIS:  nuclear sclerotic cataract right eye   OPERATIVE PROCEDURE: Procedure(s): CATARACT EXTRACTION PHACO AND INTRAOCULAR LENS PLACEMENT (IOC)-RIGHT   SURGEON:  Birder Robson, MD.   ANESTHESIA:  Anesthesiologist: [facility], Jane Canary, MD CRNA: Nelda Marseille, CRNA  1.      Managed anesthesia care. 2.      0.48ml of Shugarcaine was instilled in the eye following the paracentesis.   COMPLICATIONS:  None.   TECHNIQUE:   Stop and chop   DESCRIPTION OF PROCEDURE:  The patient was examined and consented in the preoperative holding area where the aforementioned topical anesthesia was applied to the right eye and then brought back to the Operating Room where the right eye was prepped and draped in the usual sterile ophthalmic fashion and a lid speculum was placed. A paracentesis was created with the side port blade and the anterior chamber was filled with viscoelastic. A near clear corneal incision was performed with the steel keratome. A continuous curvilinear capsulorrhexis was performed with a cystotome followed by the capsulorrhexis forceps. Hydrodissection and hydrodelineation were carried out with BSS on a blunt cannula. The lens was removed in a stop and chop  technique and the remaining cortical material was removed with the irrigation-aspiration handpiece. The capsular bag was inflated with viscoelastic and the Technis [id number]  lens was placed in the capsular bag without complication. The remaining viscoelastic was removed from the eye with the irrigation-aspiration handpiece. The wounds were hydrated. The anterior chamber was flushed with Miostat and the eye was inflated to physiologic pressure. 0.47ml of Vigamox was placed in the anterior chamber. The wounds were found to be water tight. The eye was dressed with Vigamox. The patient was given protective glasses to wear throughout the day and a shield  with which to sleep tonight. The patient was also given drops with which to begin a drop regimen today and will follow-up with me in one day.  Implant Name Type Inv. Item Serial No. Manufacturer Lot No. LRB No. Used  LENS IOL DIOP 19.0 - [id number] Intraocular Lens LENS IOL DIOP 19.0 [id number] AMO   Right 1   Procedure(s) with comments: CATARACT EXTRACTION PHACO AND INTRAOCULAR LENS PLACEMENT (IOC)-RIGHT (Right) - [place] 00:35 AP% 10.3 CDE 3.66 Fluid pack lot # [id number]  Electronically signed: [place] 10/26/2016 9:44 AM

## 2016-10-26 NOTE — Discharge Instructions (Signed)
Eye Surgery Discharge Instructions  Expect mild scratchy sensation or mild soreness. DO NOT RUB YOUR EYE!  The day of surgery: . Minimal physical activity, but bed rest is not required . No reading, computer work, or close hand work . No bending, lifting, or straining. . May watch TV  For 24 hours: . No driving, legal decisions, or alcoholic beverages . Safety precautions . Eat anything you prefer: It is better to start with liquids, then soup then solid foods. . _____ Eye patch should be worn until postoperative exam tomorrow. . ____ Solar shield eyeglasses should be worn for comfort in the sunlight/patch while sleeping  Resume all regular medications including aspirin or Coumadin if these were discontinued prior to surgery. You may shower, bathe, shave, or wash your hair. Tylenol may be taken for mild discomfort.  Call your doctor if you experience significant pain, nausea, or vomiting, fever > 101 or other signs of infection. [phone] or [phone] Specific instructions:  Follow-up Information    Birder Robson, MD Follow up on 10/27/2016.   Specialty:  Ophthalmology Why:  at  Contact information: [place] [id number] [phone]

## 2016-10-26 NOTE — Anesthesia Procedure Notes (Signed)
Date/Time: 10/26/2016 9:25 AM Performed by: Nelda Marseille Pre-anesthesia Checklist: Patient identified, Emergency Drugs available, Suction available, Patient being monitored and Timeout performed Oxygen Delivery Method: Nasal cannula

## 2016-10-26 NOTE — H&P (Signed)
All labs reviewed. Abnormal studies sent to patients PCP when indicated.  Previous H&P reviewed, patient examined, there are NO CHANGES.  Wendy Decker LOUIS10/23/20189:17 AM

## 2016-10-26 NOTE — Transfer of Care (Signed)
Immediate Anesthesia Transfer of Care Note  Patient: Wendy Decker  Procedure(s) Performed: CATARACT EXTRACTION PHACO AND INTRAOCULAR LENS PLACEMENT (IOC)-RIGHT (Right Eye)  Patient Location: PACU  Anesthesia Type:MAC  Level of Consciousness: awake, alert  and oriented  Airway & Oxygen Therapy: Patient Spontanous Breathing  Post-op Assessment: Report given to RN and Post -op Vital signs reviewed and stable  Post vital signs: Reviewed and stable  Last Vitals:  Vitals:   10/26/16 0809  BP: (!) 148/69  Pulse: 73  Resp: 16  Temp: 37 C  SpO2: 100%    Last Pain:  Vitals:   10/26/16 0809  TempSrc: Tympanic         Complications: No apparent anesthesia complications

## 2016-10-26 NOTE — Anesthesia Post-op Follow-up Note (Signed)
Anesthesia QCDR form completed.        

## 2016-10-26 NOTE — Anesthesia Postprocedure Evaluation (Signed)
Anesthesia Post Note  Patient: Wendy Decker  Procedure(s) Performed: CATARACT EXTRACTION PHACO AND INTRAOCULAR LENS PLACEMENT (IOC)-RIGHT (Right Eye)  Patient location during evaluation: PACU Anesthesia Type: MAC Level of consciousness: awake, awake and alert and oriented Pain management: pain level controlled Vital Signs Assessment: post-procedure vital signs reviewed and stable Respiratory status: spontaneous breathing, nonlabored ventilation and respiratory function stable Cardiovascular status: stable Anesthetic complications: no     Last Vitals:  Vitals:   10/26/16 0809  BP: (!) 148/69  Pulse: 73  Resp: 16  Temp: 37 C  SpO2: 100%    Last Pain:  Vitals:   10/26/16 0809  TempSrc: Tympanic                 Dennys Traughber,  [facility]

## 2016-10-26 NOTE — Anesthesia Preprocedure Evaluation (Signed)
Anesthesia Evaluation  Patient identified by MRN, date of birth, ID band Patient awake    Reviewed: Allergy & Precautions, NPO status , Patient's Chart, lab work & pertinent test results  Airway Mallampati: II       Dental  (+) Teeth Intact   Pulmonary neg pulmonary ROS,    Pulmonary exam normal        Cardiovascular Exercise Tolerance: Good  Rhythm:Regular     Neuro/Psych TIAnegative psych ROS   GI/Hepatic negative GI ROS, Neg liver ROS,   Endo/Other  negative endocrine ROS  Renal/GU negative Renal ROS  negative genitourinary   Musculoskeletal   Abdominal Normal abdominal exam  (+)   Peds  Hematology  (+) anemia ,   Anesthesia Other Findings   Reproductive/Obstetrics                             Anesthesia Physical Anesthesia Plan  ASA: II  Anesthesia Plan: MAC   Post-op Pain Management:    Induction: Intravenous  PONV Risk Score and Plan: 0  Airway Management Planned: Natural Airway and Nasal Cannula  Additional Equipment:   Intra-op Plan:   Post-operative Plan:   Informed Consent: I have reviewed the patients History and Physical, chart, labs and discussed the procedure including the risks, benefits and alternatives for the proposed anesthesia with the patient or authorized representative who has indicated his/her understanding and acceptance.     Plan Discussed with: CRNA  Anesthesia Plan Comments:         Anesthesia Quick Evaluation

## 2017-06-18 IMAGING — CT CT HEAD W/O CM
3 series · 15 of 45 positions shown, 18 images · non-contrast
Comparison: None.

CLINICAL DATA: Left-sided weakness for 1 day ; mild blurred vision

EXAM:
CT HEAD WITHOUT CONTRAST
TECHNIQUE: Contiguous axial images were obtained from the base of the skull
through the vertex without intravenous contrast.

[Series 2: head wo · axial · 0.39mm/px · z∈[-120,-4]mm · 9 of 28 slices shown, 12 images]
[im 3/28  brain]
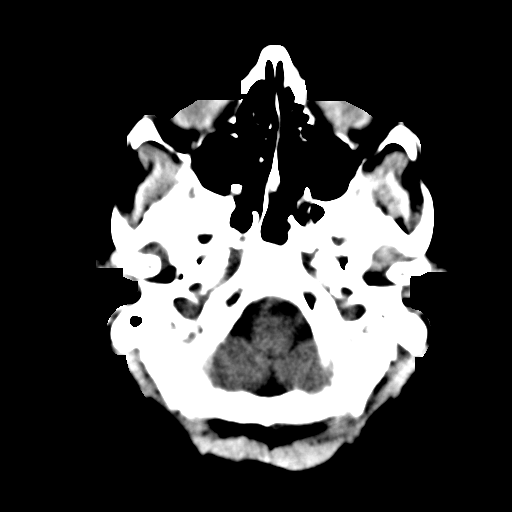
[im 3/28  bone]
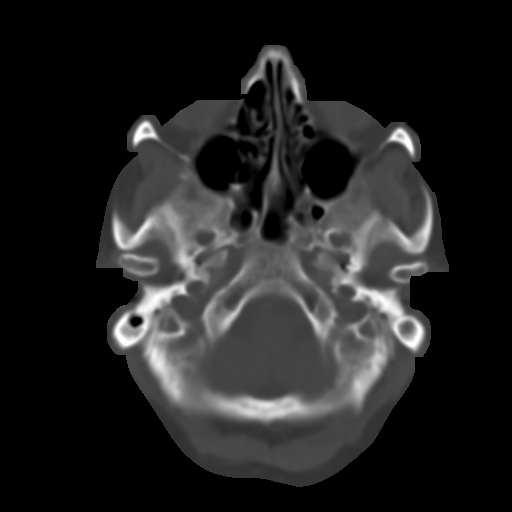
[im 6/28  brain]
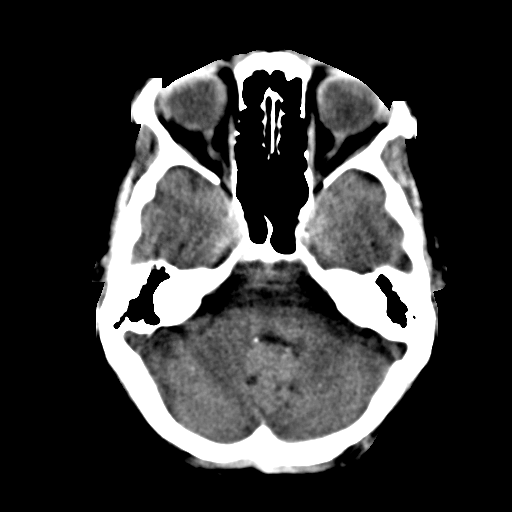
[im 9/28  brain]
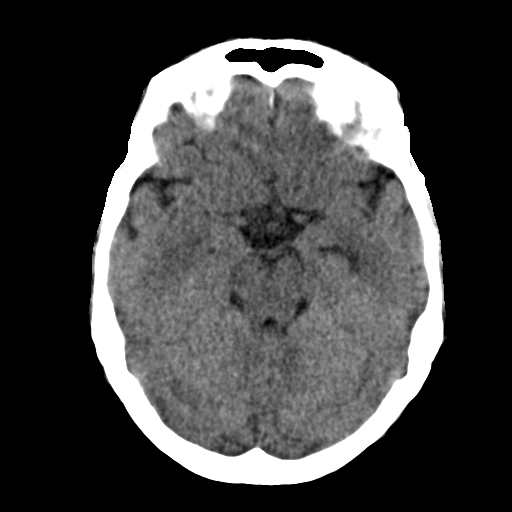
[im 12/28  brain]
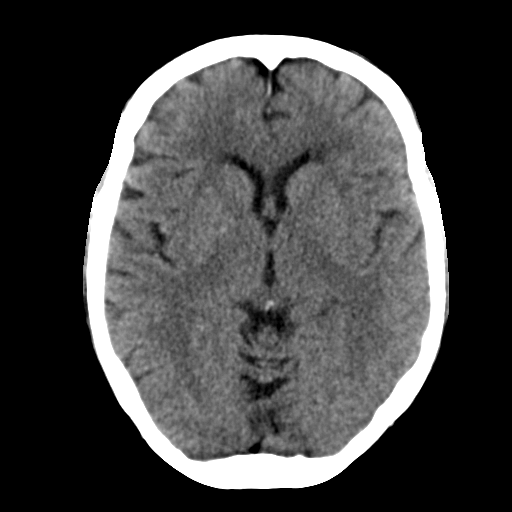
[im 15/28  brain]
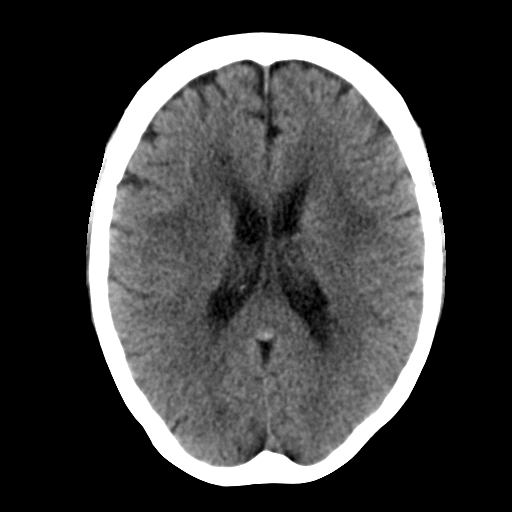
[im 15/28  bone]
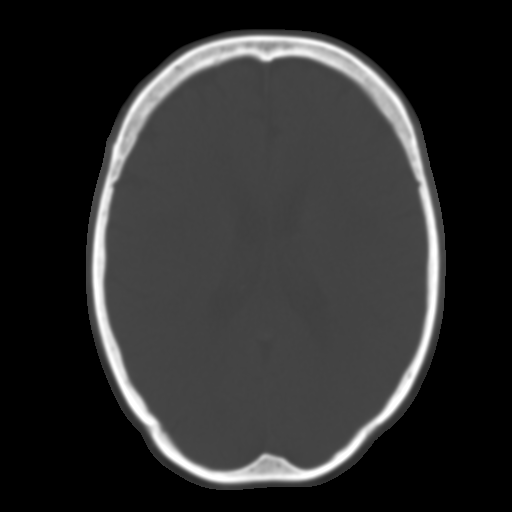
[im 17/28  brain]
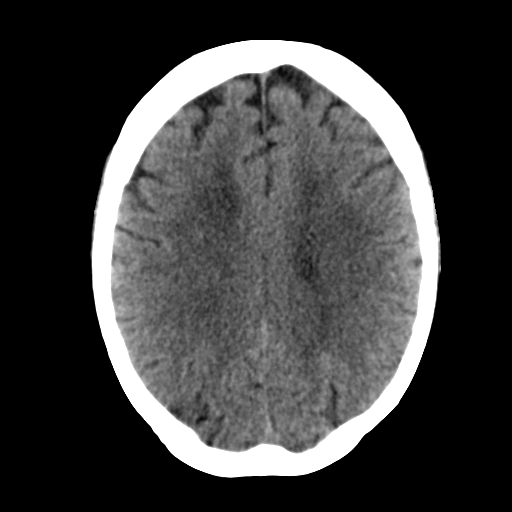
[im 20/28  brain]
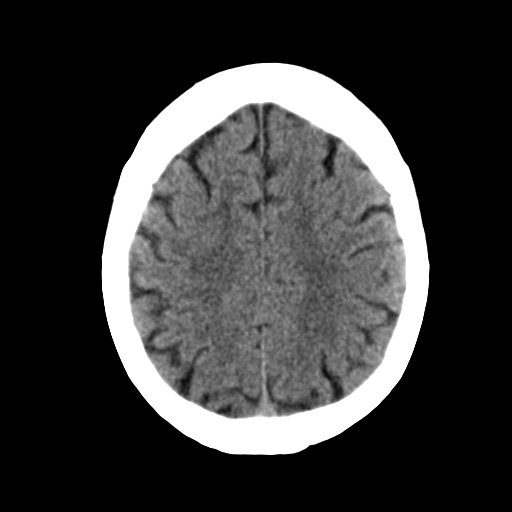
[im 23/28  brain]
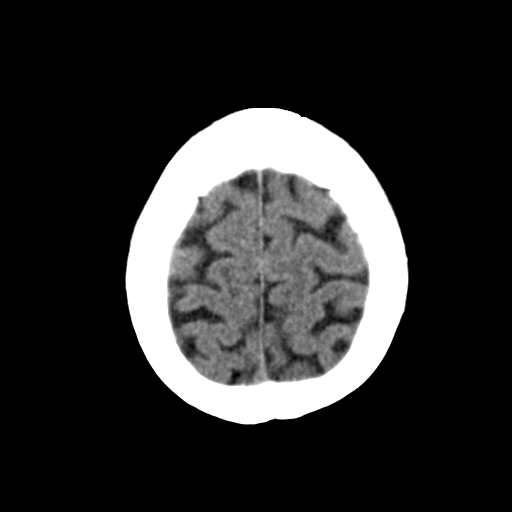
[im 26/28  brain]
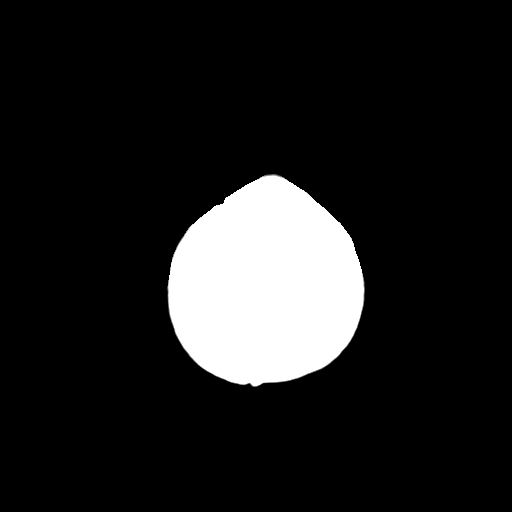
[im 26/28  bone]
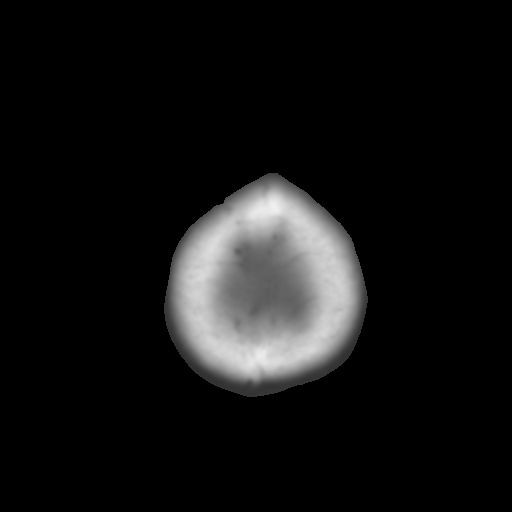

[Series 4: coronal soft · coronal · 0.28mm/px · 3 of 59 slices shown]
[im 20/59  brain]
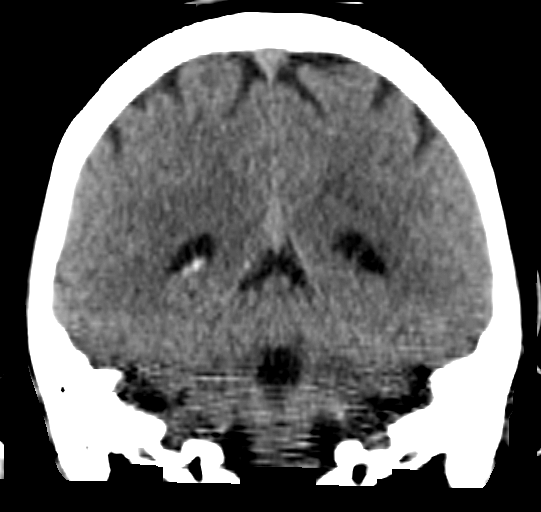
[im 26/59  brain]
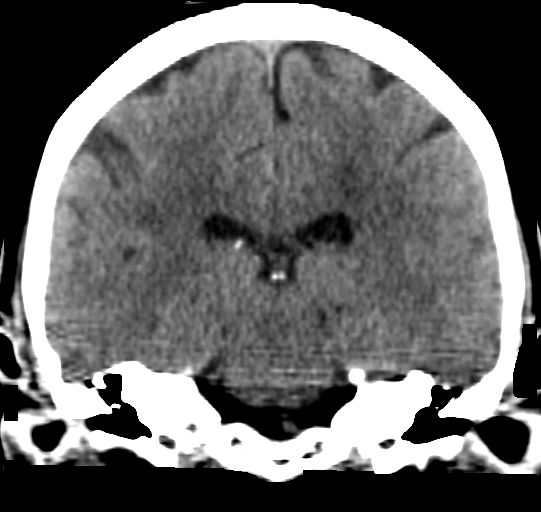
[im 33/59  brain]
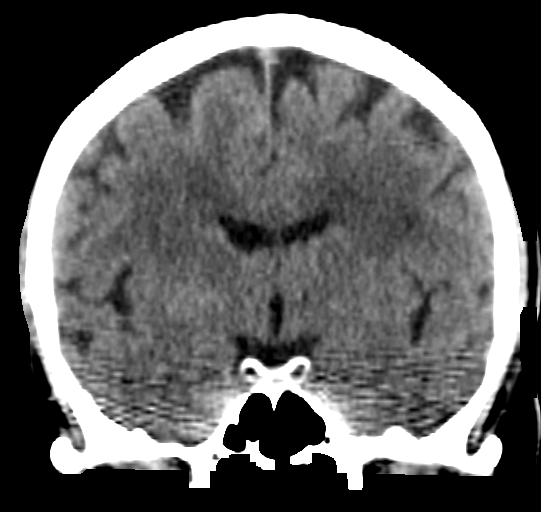

[Series 5: sagittal soft · sagittal · 0.28mm/px · 3 of 50 slices shown]
[im 17/50  brain]
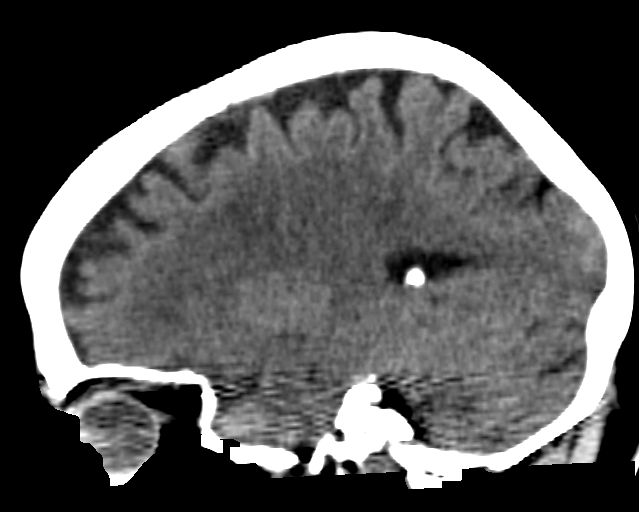
[im 25/50  brain]
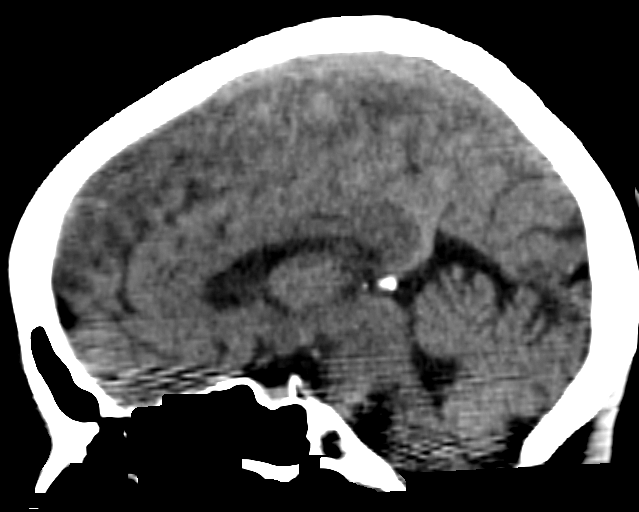
[im 33/50  brain]
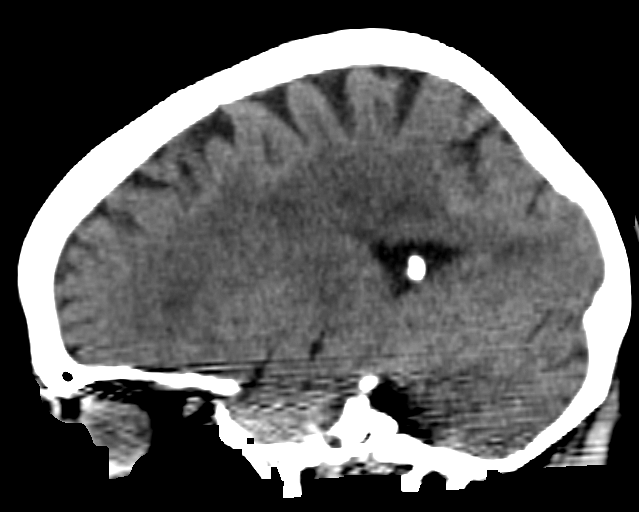

[15 of 45 positions shown; findings below may reference images not displayed]

FINDINGS: Brain: There is age related volume loss. There is no intracranial
mass, hemorrhage, extra-axial fluid collection, or midline shift.
There is patchy small vessel disease throughout the centra semiovale
bilaterally. Elsewhere gray-white compartments appear unremarkable.
No acute infarct evident.

Vascular: There is calcification in the cavernous carotid artery
regions bilaterally, more on the left than on the right. There also
foci of calcification in the distal left vertebral artery. No
hyperdense vessels are evident.

Skull: The bony calvarium appears intact.

Sinuses/Orbits: The visualized paranasal sinuses appear normal. The
lens of the left globe appears somewhat irregular in contour. Orbits
otherwise appear normal bilaterally.

Other: Mastoid air cells are clear.
IMPRESSION: Age related volume loss with patchy periventricular small vessel
disease. No acute infarct evident. No hemorrhage or mass effect.

There are foci of carotid artery calcification. The left lens
appears somewhat irregular by CT.

## 2017-06-18 IMAGING — DX DG CHEST 1V PORT
1 series · 1 of 1 positions shown · non-contrast
Comparison: None.

CLINICAL DATA: Left arm numbness and weakness.

EXAM:
PORTABLE CHEST 1 VIEW

[chest ap]
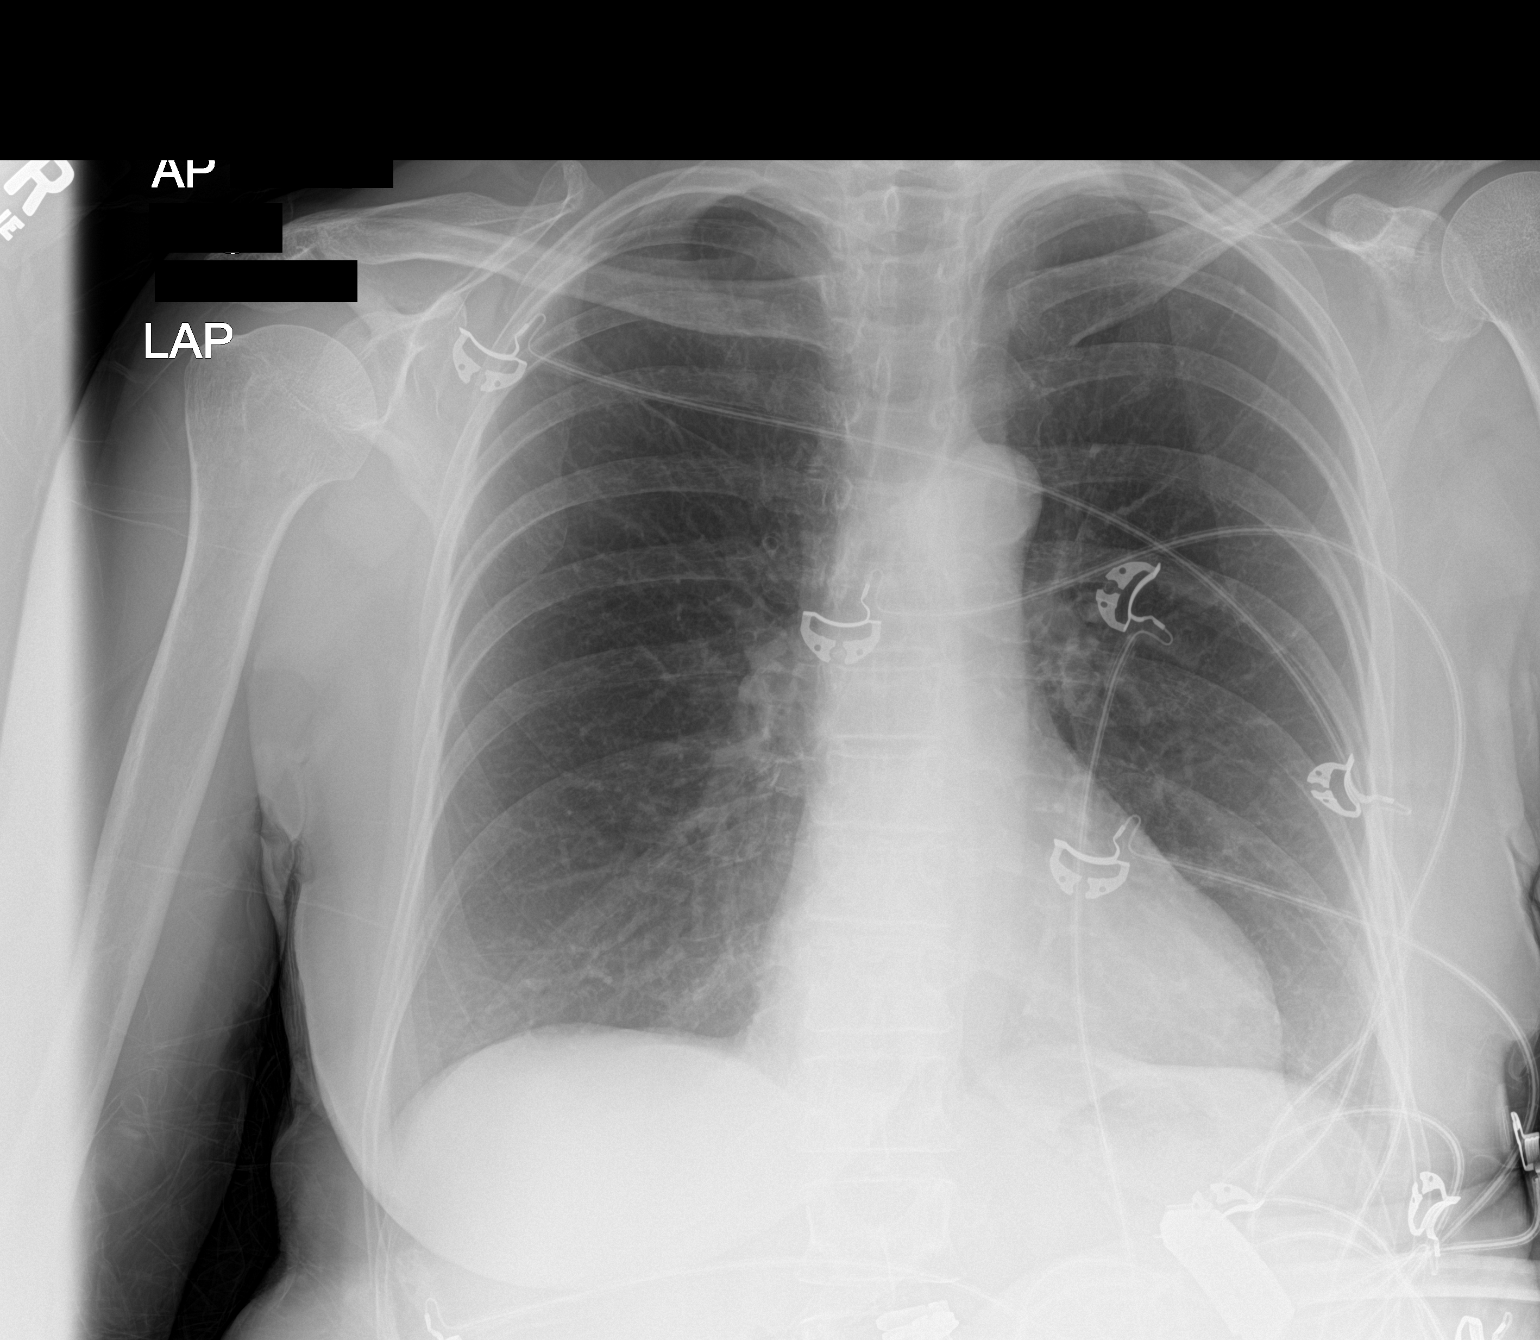

[1 of 1 positions shown; findings below may reference images not displayed]

FINDINGS: Lungs are clear. Heart size and pulmonary vascularity are normal. No
adenopathy. There is atherosclerotic calcification in the aortic
arch. No adenopathy. No bone lesions.
IMPRESSION: Aortic atherosclerosis.  No edema or consolidation.

## 2017-06-18 IMAGING — MR MR CERVICAL SPINE W/O CM
5 series · 35 of 48 positions shown · non-contrast
Comparison: Brain MRI from today reported separately. Cervical
spine radiographs 04/17/2008.

CLINICAL DATA: 74-year-old female with left lower extremity
numbness in weakness acute onset this morning, now resolved. Initial
encounter.

EXAM:
MRI CERVICAL SPINE WITHOUT CONTRAST
TECHNIQUE: Multiplanar, multisequence MR imaging of the cervical spine was
performed. No intravenous contrast was administered.

[Series 3: T2 · sagittal · 3.0mm · 0.70mm/px · 8 of 17 slices shown (1 of 2)]
[im 1/17]
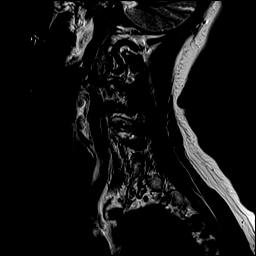
[im 3/17]
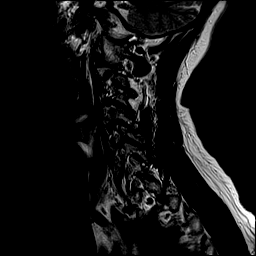
[im 5/17]
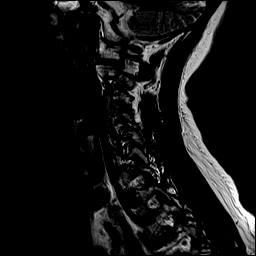
[im 7/17]
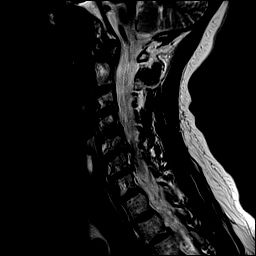
[im 10/17]
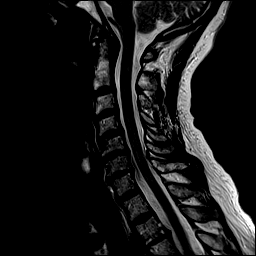
[im 12/17]
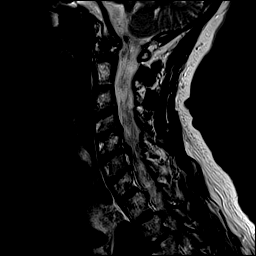
[im 14/17]
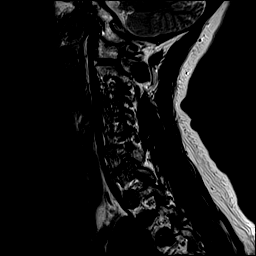
[im 17/17]
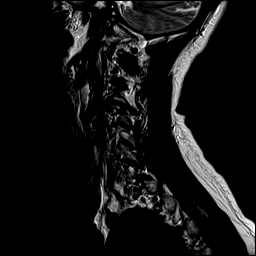

[Series 4: T1 · sagittal · 3.0mm · 0.70mm/px · 8 of 17 slices shown]
[im 1/17]
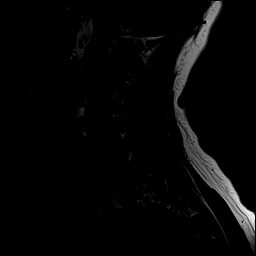
[im 3/17]
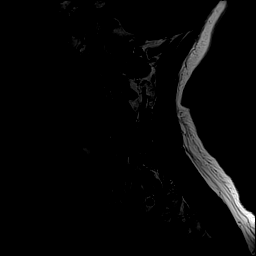
[im 5/17]
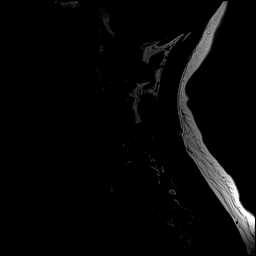
[im 7/17]
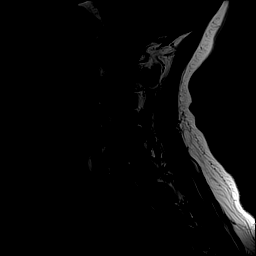
[im 10/17]
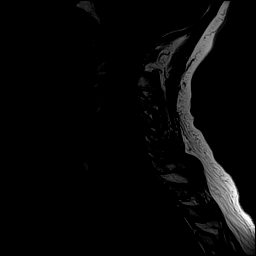
[im 12/17]
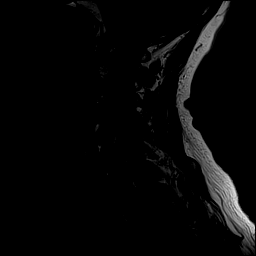
[im 14/17]
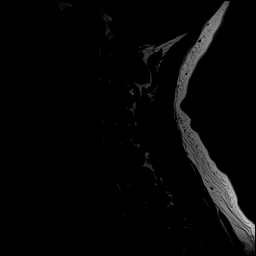
[im 17/17]
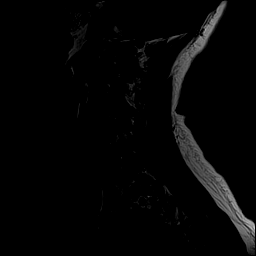

[Series 5: STIR · sagittal · 3.0mm · 0.35mm/px · 8 of 17 slices shown]
[im 1/17]
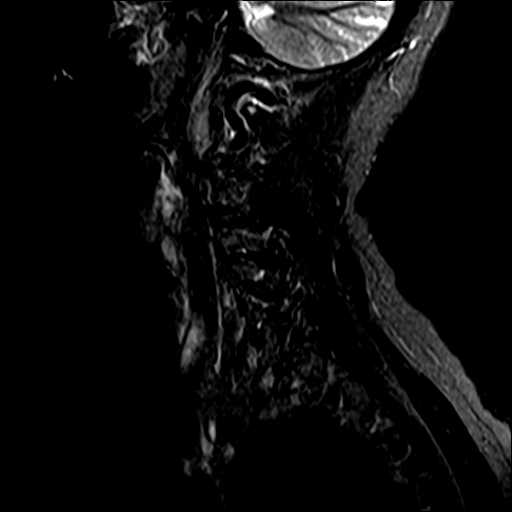
[im 3/17]
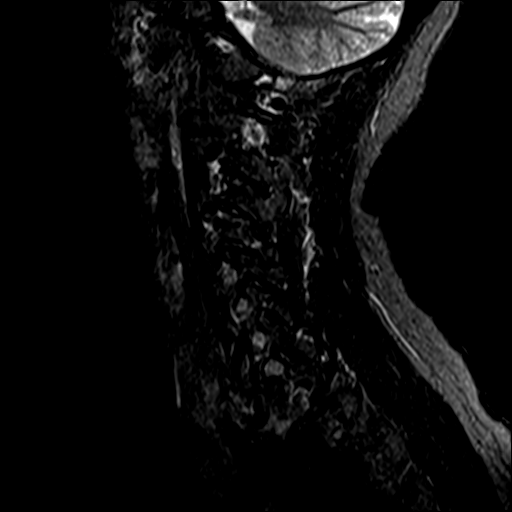
[im 5/17]
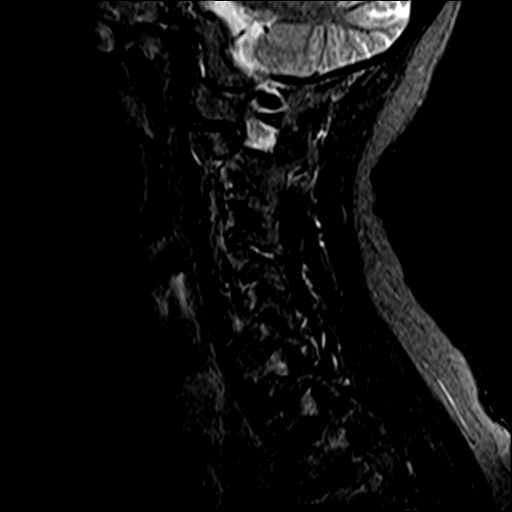
[im 7/17]
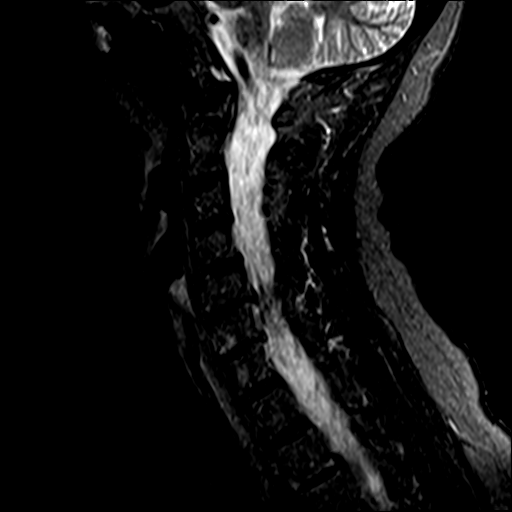
[im 10/17]
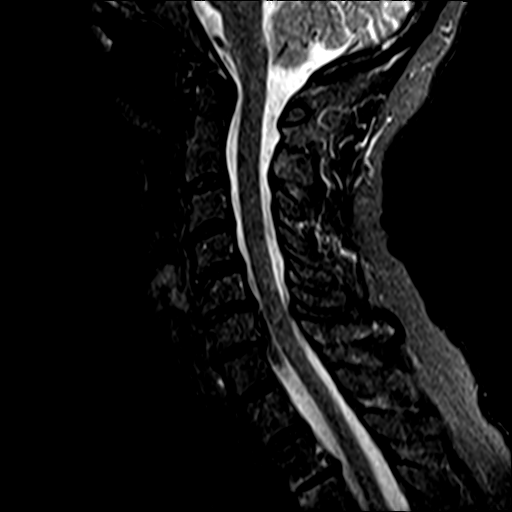
[im 12/17]
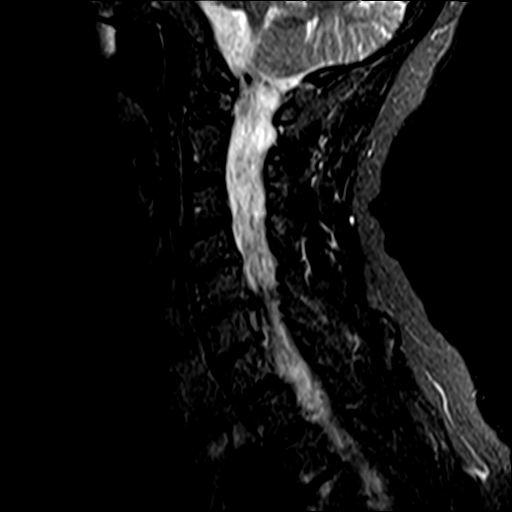
[im 14/17]
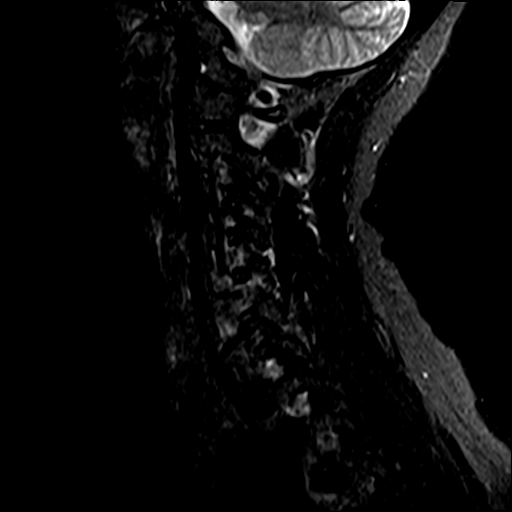
[im 17/17]
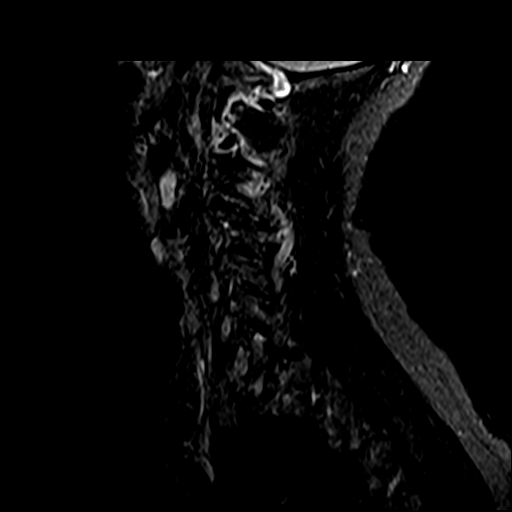

[Series 6: T2 · axial · 3.0mm · 0.70mm/px · z∈[-45,+54]mm · 9 of 27 slices shown (2 of 2)]
[im 1/27]
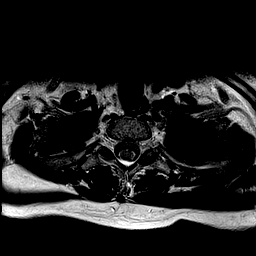
[im 5/27]
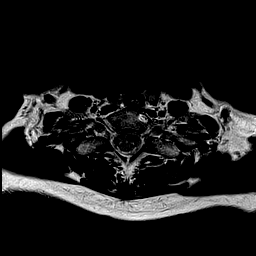
[im 8/27]
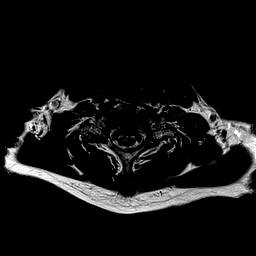
[im 12/27]
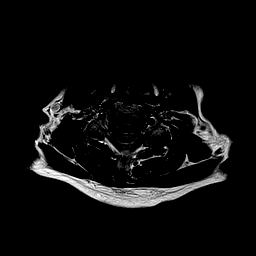
[im 15/27]
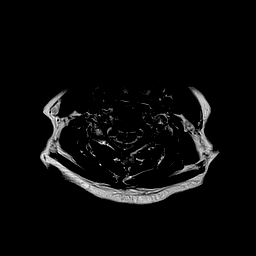
[im 19/27]
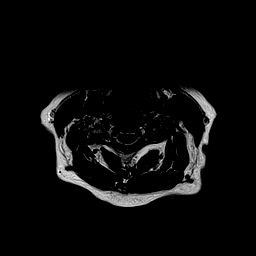
[im 22/27]
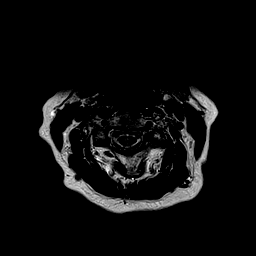
[im 24/27]
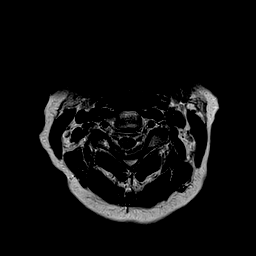
[im 27/27]
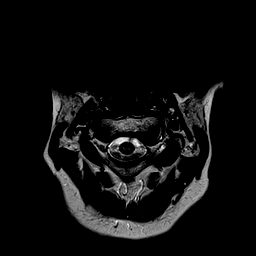

[Series 7: mpgr ax · axial · 3.0mm · 0.35mm/px · z∈[-45,-30]mm · 2 of 27 slices shown]
[im 1/27]
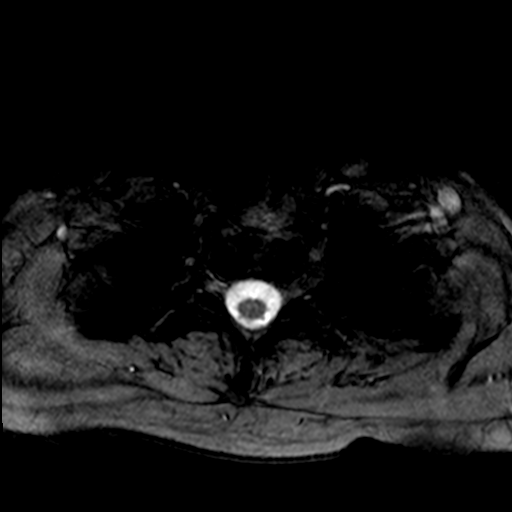
[im 5/27]
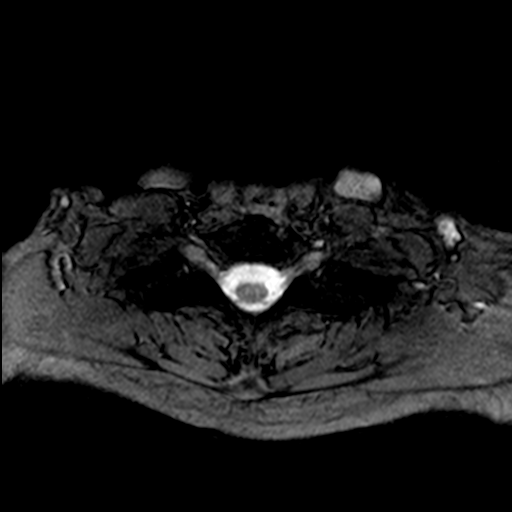

[35 of 48 positions shown; findings below may reference images not displayed]

FINDINGS: Alignment: Improved cervical lordosis compared to 4090.

Vertebrae: Mild degenerative appearing endplate marrow edema
anteriorly and left laterally at C6-C7. Incidental superimposed C7
vertebral body hemangioma. No acute osseous abnormality identified.

Cord: Spinal cord signal is within normal limits at all visualized
levels. Cervicomedullary junction is within normal limits.

Posterior Fossa, vertebral arteries, paraspinal tissues: Preserved
major vascular flow voids in the neck. Negative paraspinal soft
tissues.

Disc levels:
C2-C3: Moderate to severe left facet hypertrophy. Mild to moderate
facet hypertrophy on the right. No spinal stenosis. Mild left C3
foraminal stenosis.

C3-C4: Moderate to severe facet hypertrophy greater on the left.
Mild disc bulge and ligament flavum hypertrophy. No spinal stenosis.
Moderate left greater than right C4 foraminal stenosis.

C4-C5: Moderate facet hypertrophy greater on the left. Mild disc
bulge and uncovertebral hypertrophy. No spinal stenosis. Moderate
left and mild right C5 foraminal stenosis.

C5-C6: Circumferential disc osteophyte complex. Broad-based
posterior component of disc. Mild facet hypertrophy. Uncovertebral
hypertrophy. Borderline to mild spinal stenosis. Moderate left
greater than right C6 foraminal stenosis.

C6-C7: Disc space loss. Circumferential disc osteophyte complex.
Broad-based posterior component. Mild to moderate facet hypertrophy
greater on the right. Uncovertebral hypertrophy. Mild ligament
flavum hypertrophy. Borderline to mild spinal stenosis. Moderate
right greater than left C7 foraminal stenosis.

C7-T1:  Mild to moderate facet hypertrophy.  No stenosis.

No upper thoracic spinal stenosis despite a small central disc
protrusion at T2-T3.
IMPRESSION: 1. Degenerative findings consisting of predominantly facet
arthropathy in the upper cervical spine, and disc and endplate
degeneration in the lower cervical spine.
2. There is borderline to mild spinal stenosis at C5-C6 and C6-C7
with no spinal cord mass effect or signal abnormality.
3. There is widespread moderate cervical foraminal stenosis.
4.  No acute osseous abnormality identified.

## 2017-10-14 ENCOUNTER — Other Ambulatory Visit: Payer: Self-pay | Admitting: Family Medicine

## 2017-10-14 DIAGNOSIS — Z1231 Encounter for screening mammogram for malignant neoplasm of breast: Secondary | ICD-10-CM

## 2017-10-26 ENCOUNTER — Ambulatory Visit
Admission: RE | Admit: 2017-10-26 | Discharge: 2017-10-26 | Disposition: A | Discharge status: Home / Self Care | Payer: Medicare Other | Source: Ambulatory Visit | Attending: Family Medicine | Admitting: Family Medicine

## 2017-10-26 DIAGNOSIS — Z1231 Encounter for screening mammogram for malignant neoplasm of breast: Secondary | ICD-10-CM | POA: Insufficient documentation

## 2018-09-12 ENCOUNTER — Other Ambulatory Visit: Payer: Self-pay | Admitting: Family Medicine

## 2018-09-12 DIAGNOSIS — Z1231 Encounter for screening mammogram for malignant neoplasm of breast: Secondary | ICD-10-CM

## 2018-10-30 ENCOUNTER — Ambulatory Visit
Admission: RE | Admit: 2018-10-30 | Discharge: 2018-10-30 | Disposition: A | Discharge status: Home / Self Care | Payer: BC Managed Care – PPO | Source: Ambulatory Visit | Attending: Family Medicine | Admitting: Family Medicine

## 2018-10-30 DIAGNOSIS — Z1231 Encounter for screening mammogram for malignant neoplasm of breast: Secondary | ICD-10-CM | POA: Diagnosis present

## 2019-09-25 ENCOUNTER — Other Ambulatory Visit: Payer: Self-pay | Admitting: Family Medicine

## 2019-09-25 DIAGNOSIS — Z1231 Encounter for screening mammogram for malignant neoplasm of breast: Secondary | ICD-10-CM

## 2019-10-31 ENCOUNTER — Other Ambulatory Visit: Payer: Self-pay

## 2019-10-31 ENCOUNTER — Ambulatory Visit
Admission: RE | Admit: 2019-10-31 | Discharge: 2019-10-31 | Disposition: A | Discharge status: Home / Self Care | Payer: Medicare HMO | Source: Ambulatory Visit | Attending: Family Medicine | Admitting: Family Medicine

## 2019-10-31 DIAGNOSIS — Z1231 Encounter for screening mammogram for malignant neoplasm of breast: Secondary | ICD-10-CM | POA: Diagnosis not present

## 2020-09-16 ENCOUNTER — Ambulatory Visit: Payer: Medicare HMO | Admitting: Dermatology

## 2020-09-16 ENCOUNTER — Other Ambulatory Visit: Payer: Self-pay

## 2020-09-16 DIAGNOSIS — D2272 Melanocytic nevi of left lower limb, including hip: Secondary | ICD-10-CM | POA: Diagnosis not present

## 2020-09-16 DIAGNOSIS — L820 Inflamed seborrheic keratosis: Secondary | ICD-10-CM

## 2020-09-16 DIAGNOSIS — L578 Other skin changes due to chronic exposure to nonionizing radiation: Secondary | ICD-10-CM

## 2020-09-16 DIAGNOSIS — L738 Other specified follicular disorders: Secondary | ICD-10-CM | POA: Diagnosis not present

## 2020-09-16 DIAGNOSIS — Z1283 Encounter for screening for malignant neoplasm of skin: Secondary | ICD-10-CM

## 2020-09-16 DIAGNOSIS — D229 Melanocytic nevi, unspecified: Secondary | ICD-10-CM

## 2020-09-16 DIAGNOSIS — L821 Other seborrheic keratosis: Secondary | ICD-10-CM

## 2020-09-16 DIAGNOSIS — D1800 Hemangioma unspecified site: Secondary | ICD-10-CM

## 2020-09-16 DIAGNOSIS — D3610 Benign neoplasm of peripheral nerves and autonomic nervous system, unspecified: Secondary | ICD-10-CM

## 2020-09-16 DIAGNOSIS — D3613 Benign neoplasm of peripheral nerves and autonomic nervous system of lower limb, including hip: Secondary | ICD-10-CM | POA: Diagnosis not present

## 2020-09-16 DIAGNOSIS — L814 Other melanin hyperpigmentation: Secondary | ICD-10-CM

## 2020-09-16 NOTE — Progress Notes (Signed)
Follow-Up Visit   Subjective  Wendy Decker is a 80 y.o. female who presents for the following: Annual Exam (Patient here for full body skin exam and skin cancer screening. Patient does not have a hx of skin cancer or atypical nevi. Patient does have some spots at face that she would like checked. ). They get itchy when she sweats. She also has itchy bumps under the breasts that she would like removed.  The following portions of the chart were reviewed this encounter and updated as appropriate:       Review of Systems:  No other skin or systemic complaints except as noted in HPI or Assessment and Plan.  Objective  Well appearing patient in no apparent distress; mood and affect are within normal limits.  A full examination was performed including scalp, head, eyes, ears, nose, lips, neck, chest, axillae, abdomen, back, buttocks, bilateral upper extremities, bilateral lower extremities, hands, feet, fingers, toes, fingernails, and toenails. All findings within normal limits unless otherwise noted below.  upper sternum 5 x 49m tan waxy tan macule, lighter center  Left Lateral Thigh 170mmedium dark brown macule  right medial calf, left 4th toe Soft flesh papule  right nasal tip 2.69m72mlesh white firm papule  Left Inframammary x 3, right inframammary x 3, left forehead x 2 (8) Erythematous keratotic or waxy stuck-on papule.    Assessment & Plan  Seborrheic keratosis upper sternum  Vs lentigo  Benign-appearing.  Observation.  Call clinic for new or changing lesions.  Recommend daily use of broad spectrum spf 30+ sunscreen to sun-exposed areas.    Nevus Left Lateral Thigh  Benign-appearing.  Observation.  Call clinic for new or changing lesions.  Recommend daily use of broad spectrum spf 30+ sunscreen to sun-exposed areas.    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     NF on foot gets irritated by shoe.  Discussed shave removal, but patient  defers shave removal at this time.    Sebaceous hyperplasia right nasal tip  Vs fibrous papule  Benign-appearing.  Observation.  Call clinic for new or changing lesions.  Recommend daily use of broad spectrum spf 30+ sunscreen to sun-exposed areas.    Inflamed seborrheic keratosis Left Inframammary x 3, right inframammary x 3, left forehead x 2  Destruction of lesion - Left Inframammary x 3, right inframammary x 3, left forehead x 2  Destruction method: cryotherapy   Informed consent: discussed and consent obtained   Lesion destroyed using liquid nitrogen: Yes   Region frozen until ice ball extended beyond lesion: Yes   Outcome: patient tolerated procedure well with no complications   Post-procedure details: wound care instructions given   Additional details:  Prior to procedure, discussed risks of blister formation, small wound, skin dyspigmentation, or rare scar following cryotherapy. Recommend Vaseline ointment to treated areas while healing.   Lentigines - Scattered tan macules - Due to sun exposure - Benign-appearing, observe - Recommend daily broad spectrum sunscreen SPF 30+ to sun-exposed areas, reapply every 2 hours as needed. - Call for any changes  Seborrheic Keratoses - Stuck-on, waxy, tan-brown papules and/or plaques  - Benign-appearing - Discussed benign etiology and prognosis. - Observe - Call for any changes  Melanocytic Nevi - Tan-brown and/or pink-flesh-colored symmetric macules and papules - Benign appearing on exam today - Observation - Call clinic for new or changing moles - Recommend daily use of broad spectrum spf 30+ sunscreen to sun-exposed areas.   Hemangiomas -  Red papules - Discussed benign nature - Observe - Call for any changes  Actinic Damage - Chronic condition, secondary to cumulative UV/sun exposure - diffuse scaly erythematous macules with underlying dyspigmentation - Recommend daily broad spectrum sunscreen SPF 30+ to  sun-exposed areas, reapply every 2 hours as needed.  - Staying in the shade or wearing long sleeves, sun glasses (UVA+UVB protection) and wide brim hats (4-inch brim around the entire circumference of the hat) are also recommended for sun protection.  - Call for new or changing lesions.  Skin cancer screening performed today.  Return in about 1 year (around 09/16/2021) for TBSE.  Graciella Belton, RMA, am acting as scribe for Brendolyn Patty, MD .  Documentation: I have reviewed the above documentation for accuracy and completeness, and I agree with the above.  Brendolyn Patty MD

## 2020-09-16 NOTE — Patient Instructions (Signed)

## 2020-10-20 ENCOUNTER — Other Ambulatory Visit: Payer: Self-pay | Admitting: Family Medicine

## 2020-10-20 DIAGNOSIS — Z1231 Encounter for screening mammogram for malignant neoplasm of breast: Secondary | ICD-10-CM

## 2020-11-07 ENCOUNTER — Other Ambulatory Visit: Payer: Self-pay

## 2020-11-07 ENCOUNTER — Ambulatory Visit
Admission: RE | Admit: 2020-11-07 | Discharge: 2020-11-07 | Disposition: A | Discharge status: Home / Self Care | Payer: Medicare HMO | Source: Ambulatory Visit | Attending: Family Medicine | Admitting: Family Medicine

## 2020-11-07 DIAGNOSIS — Z1231 Encounter for screening mammogram for malignant neoplasm of breast: Secondary | ICD-10-CM | POA: Insufficient documentation

## 2021-09-22 ENCOUNTER — Ambulatory Visit (INDEPENDENT_AMBULATORY_CARE_PROVIDER_SITE_OTHER): Payer: Medicare HMO | Admitting: Dermatology

## 2021-09-22 DIAGNOSIS — Z1283 Encounter for screening for malignant neoplasm of skin: Secondary | ICD-10-CM | POA: Diagnosis not present

## 2021-09-22 DIAGNOSIS — L578 Other skin changes due to chronic exposure to nonionizing radiation: Secondary | ICD-10-CM

## 2021-09-22 DIAGNOSIS — L738 Other specified follicular disorders: Secondary | ICD-10-CM | POA: Diagnosis not present

## 2021-09-22 DIAGNOSIS — L821 Other seborrheic keratosis: Secondary | ICD-10-CM | POA: Diagnosis not present

## 2021-09-22 DIAGNOSIS — D1801 Hemangioma of skin and subcutaneous tissue: Secondary | ICD-10-CM

## 2021-09-22 DIAGNOSIS — D229 Melanocytic nevi, unspecified: Secondary | ICD-10-CM

## 2021-09-22 DIAGNOSIS — D2272 Melanocytic nevi of left lower limb, including hip: Secondary | ICD-10-CM

## 2021-09-22 DIAGNOSIS — D225 Melanocytic nevi of trunk: Secondary | ICD-10-CM

## 2021-09-22 DIAGNOSIS — D3610 Benign neoplasm of peripheral nerves and autonomic nervous system, unspecified: Secondary | ICD-10-CM

## 2021-09-22 DIAGNOSIS — L810 Postinflammatory hyperpigmentation: Secondary | ICD-10-CM

## 2021-09-22 DIAGNOSIS — L814 Other melanin hyperpigmentation: Secondary | ICD-10-CM

## 2021-09-22 NOTE — Patient Instructions (Signed)
Seborrheic Keratosis  What causes seborrheic keratoses? Seborrheic keratoses are harmless, common skin growths that first appear during adult life.  As time goes by, more growths appear.  Some people may develop a large number of them.  Seborrheic keratoses appear on both covered and uncovered body parts.  They are not caused by sunlight.  The tendency to develop seborrheic keratoses can be inherited.  They vary in color from skin-colored to gray, brown, or even black.  They can be either smooth or have a rough, warty surface.   Seborrheic keratoses are superficial and look as if they were stuck on the skin.  Under the microscope this type of keratosis looks like layers upon layers of skin.  That is why at times the top layer may seem to fall off, but the rest of the growth remains and re-grows.    Treatment Seborrheic keratoses do not need to be treated, but can easily be removed in the office.  Seborrheic keratoses often cause symptoms when they rub on clothing or jewelry.  Lesions can be in the way of shaving.  If they become inflamed, they can cause itching, soreness, or burning.  Removal of a seborrheic keratosis can be accomplished by freezing, burning, or surgery. If any spot bleeds, scabs, or grows rapidly, please return to have it checked, as these can be an indication of a skin cancer.   Due to recent changes in healthcare laws, you may see results of your pathology and/or laboratory studies on MyChart before the doctors have had a chance to review them. We understand that in some cases there may be results that are confusing or concerning to you. Please understand that not all results are received at the same time and often the doctors may need to interpret multiple results in order to provide you with the best plan of care or course of treatment. Therefore, we ask that you please give us 2 business days to thoroughly review all your results before contacting the office for clarification.  Should we see a critical lab result, you will be contacted sooner.   If You Need Anything After Your Visit  If you have any questions or concerns for your doctor, please call our main line at [phone] and press option 4 to reach your doctor's medical assistant. If no one answers, please leave a voicemail as directed and we will return your call as soon as possible. Messages left after 4 pm will be answered the following business day.   You may also send us a message via MyChart. We typically respond to MyChart messages within 1-2 business days.  For prescription refills, please ask your pharmacy to contact our office. Our fax number is [phone].  If you have an urgent issue when the clinic is closed that cannot wait until the next business day, you can page your doctor at the number below.    Please note that while we do our best to be available for urgent issues outside of office hours, we are not available 24/7.   If you have an urgent issue and are unable to reach us, you may choose to seek medical care at your doctor's office, retail clinic, urgent care center, or emergency room.  If you have a medical emergency, please immediately call 911 or go to the emergency department.  Pager Numbers  - Dr. Kowalski: [phone]  - Dr. Moye: [phone]  - Dr. Stewart: [phone]  In the event of inclement weather, please call our main line   at [phone] for an update on the status of any delays or closures.  Dermatology Medication Tips: Please keep the boxes that topical medications come in in order to help keep track of the instructions about where and how to use these. Pharmacies typically print the medication instructions only on the boxes and not directly on the medication tubes.   If your medication is too expensive, please contact our office at [phone] option 4 or send us a message through MyChart.   We are unable to tell what your co-pay for medications will be  in advance as this is different depending on your insurance coverage. However, we may be able to find a substitute medication at lower cost or fill out paperwork to get insurance to cover a needed medication.   If a prior authorization is required to get your medication covered by your insurance company, please allow us 1-2 business days to complete this process.  Drug prices often vary depending on where the prescription is filled and some pharmacies may offer cheaper prices.  The website www.goodrx.com contains coupons for medications through different pharmacies. The prices here do not account for what the cost may be with help from insurance (it may be cheaper with your insurance), but the website can give you the price if you did not use any insurance.  - You can print the associated coupon and take it with your prescription to the pharmacy.  - You may also stop by our office during regular business hours and pick up a GoodRx coupon card.  - If you need your prescription sent electronically to a different pharmacy, notify our office through [facility] MyChart or by phone at [phone] option 4.     Si Usted Necesita Algo Despus de Su Visita  Tambin puede enviarnos un mensaje a travs de MyChart. Por lo general respondemos a los mensajes de MyChart en el transcurso de 1 a 2 das hbiles.  Para renovar recetas, por favor pida a su farmacia que se ponga en contacto con nuestra oficina. Nuestro nmero de fax es el [phone].  Si tiene un asunto urgente cuando la clnica est cerrada y que no puede esperar hasta el siguiente da hbil, puede llamar/localizar a su doctor(a) al nmero que aparece a continuacin.   Por favor, tenga en cuenta que aunque hacemos todo lo posible para estar disponibles para asuntos urgentes fuera del horario de oficina, no estamos disponibles las 24 horas del da, los 7 das de la semana.   Si tiene un problema urgente y no puede comunicarse con nosotros,  puede optar por buscar atencin mdica  en el consultorio de su doctor(a), en una clnica privada, en un centro de atencin urgente o en una sala de emergencias.  Si tiene una emergencia mdica, por favor llame inmediatamente al 911 o vaya a la sala de emergencias.  Nmeros de bper  - Dr. Kowalski: [phone]  - Dra. Moye: [phone]  - Dra. Stewart: [phone]  En caso de inclemencias del tiempo, por favor llame a nuestra lnea principal al [phone] para una actualizacin sobre el estado de cualquier retraso o cierre.  Consejos para la medicacin en dermatologa: Por favor, guarde las cajas en las que vienen los medicamentos de uso tpico para ayudarle a seguir las instrucciones sobre dnde y cmo usarlos. Las farmacias generalmente imprimen las instrucciones del medicamento slo en las cajas y no directamente en los tubos del medicamento.   Si su medicamento es muy caro, por favor, pngase en contacto   con nuestra oficina llamando al [phone] y presione la opcin 4 o envenos un mensaje a travs de MyChart.   No podemos decirle cul ser su copago por los medicamentos por adelantado ya que esto es diferente dependiendo de la cobertura de su seguro. Sin embargo, es posible que podamos encontrar un medicamento sustituto a menor costo o llenar un formulario para que el seguro cubra el medicamento que se considera necesario.   Si se requiere una autorizacin previa para que su compaa de seguros cubra su medicamento, por favor permtanos de 1 a 2 das hbiles para completar este proceso.  Los precios de los medicamentos varan con frecuencia dependiendo del lugar de dnde se surte la receta y alguna farmacias pueden ofrecer precios ms baratos.  El sitio web www.goodrx.com tiene cupones para medicamentos de diferentes farmacias. Los precios aqu no tienen en cuenta lo que podra costar con la ayuda del seguro (puede ser ms barato con su seguro), pero el sitio web puede darle el  precio si no utiliz ningn seguro.  - Puede imprimir el cupn correspondiente y llevarlo con su receta a la farmacia.  - Tambin puede pasar por nuestra oficina durante el horario de atencin regular y recoger una tarjeta de cupones de GoodRx.  - Si necesita que su receta se enve electrnicamente a una farmacia diferente, informe a nuestra oficina a travs de MyChart de [facility] o por telfono llamando al [phone] y presione la opcin 4.  

## 2021-09-22 NOTE — Progress Notes (Signed)
Follow-Up Visit   Subjective  Wendy Decker is a 81 y.o. female who presents for the following: Annual Exam.  The patient presents for Total-Body Skin Exam (TBSE) for skin cancer screening and mole check.  The patient has spots, moles and lesions to be evaluated, some may be new. She has a small spot on her left lower leg that she noticed recently. Also a few dark spots on her cheeks.   The following portions of the chart were reviewed this encounter and updated as appropriate:       Review of Systems:  No other skin or systemic complaints except as noted in HPI or Assessment and Plan.  Objective  Well appearing patient in no apparent distress; mood and affect are within normal limits.  A full examination was performed including scalp, head, eyes, ears, nose, lips, neck, chest, axillae, abdomen, back, buttocks, bilateral upper extremities, bilateral lower extremities, hands, feet, fingers, toes, fingernails, and toenails. All findings within normal limits unless otherwise noted below.  upper sternum 5 x 89m waxy tan macule, lighter center  R nasal tip 3.067mflesh white firm papule  L lateral thigh 73m64medium dark brown macule  Left Breast 5.0 x 3.0 mm med brown macule slightly waxy  right medial calf, left 4th toe Soft flesh papules  Left lower pretibia 2.0 mm violaceous macule  bilateral antecubitum Light tan patch     Assessment & Plan  Skin cancer screening performed today.  Actinic Damage - chronic, secondary to cumulative UV radiation exposure/sun exposure over time - diffuse scaly erythematous macules with underlying dyspigmentation - Recommend daily broad spectrum sunscreen SPF 30+ to sun-exposed areas, reapply every 2 hours as needed.  - Recommend staying in the shade or wearing long sleeves, sun glasses (UVA+UVB protection) and wide brim hats (4-inch brim around the entire circumference of the hat). - Call for new or changing lesions.  Lentigines -  Scattered tan macules - Due to sun exposure - Benign-appering, observe - Recommend daily broad spectrum sunscreen SPF 30+ to sun-exposed areas, reapply every 2 hours as needed. - Call for any changes  Seborrheic Keratoses - Stuck-on, waxy, tan-brown papules and/or plaques, including bilateral cheeks  - Benign-appearing - Discussed benign etiology and prognosis. - Observe - Call for any changes  Hemangiomas - Red papules - Discussed benign nature - Observe - Call for any changes  Melanocytic Nevi - Tan-brown and/or pink-flesh-colored symmetric macules and papules - Benign appearing on exam today - Observation - Call clinic for new or changing moles - Recommend daily use of broad spectrum spf 30+ sunscreen to sun-exposed areas.   Seborrheic keratosis upper sternum  vs Lentigo. Stable.   Reassured benign age-related growth.  Recommend observation.  Discussed cryotherapy if spot(s) become irritated or inflamed.  Sebaceous hyperplasia R nasal tip  vs Fibrous Papule vs SK.  Stable. Observation.   Nevus (2) L lateral thigh; Left Breast  vs SK (L breast)  Benign-appearing.  Observation.  Call clinic for new or changing moles.  Recommend daily use of broad spectrum spf 30+ sunscreen to sun-exposed areas.   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    Hemangioma of skin Left lower pretibia  Benign, observe.    Post-inflammatory hyperpigmentation bilateral antecubitum  Possibly from sweat/irritation vs Sun Exposure.  Benign. Observation.    Return in about 1 year (around 09/23/2022) for TBSE.  Documentation: I have reviewed the above documentation for accuracy and completeness, and I agree with the  above.  Brendolyn Patty MD

## 2021-11-06 ENCOUNTER — Other Ambulatory Visit: Payer: Self-pay | Admitting: Family Medicine

## 2021-11-06 DIAGNOSIS — Z1231 Encounter for screening mammogram for malignant neoplasm of breast: Secondary | ICD-10-CM

## 2021-12-16 ENCOUNTER — Ambulatory Visit
Admission: RE | Admit: 2021-12-16 | Discharge: 2021-12-16 | Disposition: A | Discharge status: Home / Self Care | Payer: Medicare HMO | Source: Ambulatory Visit | Attending: Family Medicine | Admitting: Family Medicine

## 2021-12-16 DIAGNOSIS — Z1231 Encounter for screening mammogram for malignant neoplasm of breast: Secondary | ICD-10-CM | POA: Diagnosis not present

## 2022-10-05 ENCOUNTER — Ambulatory Visit (INDEPENDENT_AMBULATORY_CARE_PROVIDER_SITE_OTHER): Payer: Medicare HMO | Admitting: Dermatology

## 2022-10-05 DIAGNOSIS — D225 Melanocytic nevi of trunk: Secondary | ICD-10-CM

## 2022-10-05 DIAGNOSIS — L578 Other skin changes due to chronic exposure to nonionizing radiation: Secondary | ICD-10-CM

## 2022-10-05 DIAGNOSIS — L821 Other seborrheic keratosis: Secondary | ICD-10-CM

## 2022-10-05 DIAGNOSIS — L738 Other specified follicular disorders: Secondary | ICD-10-CM

## 2022-10-05 DIAGNOSIS — D2272 Melanocytic nevi of left lower limb, including hip: Secondary | ICD-10-CM

## 2022-10-05 DIAGNOSIS — L814 Other melanin hyperpigmentation: Secondary | ICD-10-CM

## 2022-10-05 DIAGNOSIS — D3610 Benign neoplasm of peripheral nerves and autonomic nervous system, unspecified: Secondary | ICD-10-CM

## 2022-10-05 DIAGNOSIS — W908XXA Exposure to other nonionizing radiation, initial encounter: Secondary | ICD-10-CM

## 2022-10-05 DIAGNOSIS — Z1283 Encounter for screening for malignant neoplasm of skin: Secondary | ICD-10-CM

## 2022-10-05 DIAGNOSIS — D3613 Benign neoplasm of peripheral nerves and autonomic nervous system of lower limb, including hip: Secondary | ICD-10-CM

## 2022-10-05 DIAGNOSIS — D1801 Hemangioma of skin and subcutaneous tissue: Secondary | ICD-10-CM

## 2022-10-05 DIAGNOSIS — L820 Inflamed seborrheic keratosis: Secondary | ICD-10-CM

## 2022-10-05 DIAGNOSIS — D229 Melanocytic nevi, unspecified: Secondary | ICD-10-CM

## 2022-10-05 NOTE — Patient Instructions (Addendum)
Cryotherapy Aftercare  Wash gently with soap and water everyday.   Apply Vaseline and Band-Aid daily until healed.   Recommend daily broad spectrum sunscreen SPF 30+ to sun-exposed areas, reapply every 2 hours as needed. Call for new or changing lesions.  Staying in the shade or wearing long sleeves, sun glasses (UVA+UVB protection) and wide brim hats (4-inch brim around the entire circumference of the hat) are also recommended for sun protection.   Melanoma ABCDEs  Melanoma is the most dangerous type of skin cancer, and is the leading cause of death from skin disease.  You are more likely to develop melanoma if you: Have light-colored skin, light-colored eyes, or red or blond hair Spend a lot of time in the sun Tan regularly, either outdoors or in a tanning bed Have had blistering sunburns, especially during childhood Have a close family member who has had a melanoma Have atypical moles or large birthmarks  Early detection of melanoma is key since treatment is typically straightforward and cure rates are extremely high if we catch it early.   The first sign of melanoma is often a change in a mole or a new dark spot.  The ABCDE system is a way of remembering the signs of melanoma.  A for asymmetry:  The two halves do not match. B for border:  The edges of the growth are irregular. C for color:  A mixture of colors are present instead of an even brown color. D for diameter:  Melanomas are usually (but not always) greater than 6mm - the size of a pencil eraser. E for evolution:  The spot keeps changing in size, shape, and color.  Please check your skin once per month between visits. You can use a small mirror in front and a large mirror behind you to keep an eye on the back side or your body.   If you see any new or changing lesions before your next follow-up, please call to schedule a visit.  Please continue daily skin protection including broad spectrum sunscreen SPF 30+ to  sun-exposed areas, reapplying every 2 hours as needed when you're outdoors.    Due to recent changes in healthcare laws, you may see results of your pathology and/or laboratory studies on MyChart before the doctors have had a chance to review them. We understand that in some cases there may be results that are confusing or concerning to you. Please understand that not all results are received at the same time and often the doctors may need to interpret multiple results in order to provide you with the best plan of care or course of treatment. Therefore, we ask that you please give [place] 2 business days to thoroughly review all your results before contacting the office for clarification. Should we see a critical lab result, you will be contacted sooner.   If You Need Anything After Your Visit  If you have any questions or concerns for your doctor, please call our main line at [phone] and press option 4 to reach your doctor's medical assistant. If no one answers, please leave a voicemail as directed and we will return your call as soon as possible. Messages left after 4 pm will be answered the following business day.   You may also send [place] a message via MyChart. We typically respond to MyChart messages within 1-2 business days.  For prescription refills, please ask your pharmacy to contact our office. Our fax number is [phone].  If you have an urgent issue  when the clinic is closed that cannot wait until the next business day, you can page your doctor at the number below.    Please note that while we do our best to be available for urgent issues outside of office hours, we are not available 24/7.   If you have an urgent issue and are unable to reach [place], you may choose to seek medical care at your doctor's office, retail clinic, urgent care center, or emergency room.  If you have a medical emergency, please immediately call 911 or go to the emergency department.  Pager Numbers  - Dr.  Gwen Pounds: [phone]  - Dr. Roseanne Reno: [phone]  - Dr. Katrinka Blazing: [phone]   In the event of inclement weather, please call our main line at [phone] for an update on the status of any delays or closures.  Dermatology Medication Tips: Please keep the boxes that topical medications come in in order to help keep track of the instructions about where and how to use these. Pharmacies typically print the medication instructions only on the boxes and not directly on the medication tubes.   If your medication is too expensive, please contact our office at [phone] option 4 or send [place] a message through MyChart.   We are unable to tell what your co-pay for medications will be in advance as this is different depending on your insurance coverage. However, we may be able to find a substitute medication at lower cost or fill out paperwork to get insurance to cover a needed medication.   If a prior authorization is required to get your medication covered by your insurance company, please allow [place] 1-2 business days to complete this process.  Drug prices often vary depending on where the prescription is filled and some pharmacies may offer cheaper prices.  The website www.goodrx.com contains coupons for medications through different pharmacies. The prices here do not account for what the cost may be with help from insurance (it may be cheaper with your insurance), but the website can give you the price if you did not use any insurance.  - You can print the associated coupon and take it with your prescription to the pharmacy.  - You may also stop by our office during regular business hours and pick up a GoodRx coupon card.  - If you need your prescription sent electronically to a different pharmacy, notify our office through [facility] or by phone at [phone] option 4.

## 2022-10-05 NOTE — Progress Notes (Signed)
Follow-Up Visit   Subjective  Wendy Decker is a 82 y.o. female who presents for the following: Skin Cancer Screening and Full Body Skin Exam  The patient presents for Total-Body Skin Exam (TBSE) for skin cancer screening and mole check. The patient has spots, moles and lesions to be evaluated, some may be new or changing and the patient may have concern these could be cancer.  No hx of skin cancer. Patient does have a few rough spots that feel like rocks at face/nose that she picks at and will bleed.   The following portions of the chart were reviewed this encounter and updated as appropriate: medications, allergies, medical history  Review of Systems:  No other skin or systemic complaints except as noted in HPI or Assessment and Plan.  Objective  Well appearing patient in no apparent distress; mood and affect are within normal limits.  A full examination was performed including scalp, head, eyes, ears, nose, lips, neck, chest, axillae, abdomen, back, buttocks, bilateral upper extremities, bilateral lower extremities, hands, feet, fingers, toes, fingernails, and toenails. All findings within normal limits unless otherwise noted below.   Relevant physical exam findings are noted in the Assessment and Plan.  L Malar Cheek x 1, L nasal tip x 1 (2) Erythematous stuck-on, waxy papule or plaque  Left breast   Assessment & Plan   SKIN CANCER SCREENING PERFORMED TODAY.  ACTINIC DAMAGE - Chronic condition, secondary to cumulative UV/sun exposure - diffuse scaly erythematous macules with underlying dyspigmentation - Recommend daily broad spectrum sunscreen SPF 30+ to sun-exposed areas, reapply every 2 hours as needed.  - Staying in the shade or wearing long sleeves, sun glasses (UVA+UVB protection) and wide brim hats (4-inch brim around the entire circumference of the hat) are also recommended for sun protection.  - Call for new or changing lesions.  LENTIGINES, SEBORRHEIC KERATOSES,  HEMANGIOMAS - Benign normal skin lesions - Benign-appearing - Call for any changes - SK vs Lentigo at upper sternum, 5 x 4mm waxy tan macule, lighter center. Stable. Observation.  MELANOCYTIC NEVI - Tan-brown and/or pink-flesh-colored symmetric macules and papules - L lateral thigh, 1mm medium dark brown macule, stable from previous visit  - L breast 5.0 x 3.0 mm med brown macule, pt states present for years no changes, stable from previous visit, photo today, recheck on f/up - Benign appearing on exam today - Observation - Call clinic for new or changing moles - Recommend daily use of broad spectrum spf 30+ sunscreen to sun-exposed areas.   - Stable. Observation.  Sebaceous hyperplasia vs Fibrous Papule vs SK. Exam: 3.77mm flesh white firm papule at R nasal tip   Benign-appearing. Stable compared to previous visit. Observation.  Call clinic for new or changing lesions.  Recommend daily use of broad spectrum spf 30+ sunscreen to sun-exposed areas.      Neurofibroma Exam: Soft flesh papules  at right medial calf, left 4th toe   Benign, observe.   Inflamed seborrheic keratosis (2) L Malar Cheek x 1, L nasal tip x 1  Symptomatic, irritating, patient would like treated.  Benign-appearing.  Call clinic for new or changing lesions.    Destruction of lesion - L Malar Cheek x 1, L nasal tip x 1 (2)  Destruction method: cryotherapy   Informed consent: discussed and consent obtained   Lesion destroyed using liquid nitrogen: Yes   Region frozen until ice ball extended beyond lesion: Yes   Outcome: patient tolerated procedure well with no complications  Post-procedure details: wound care instructions given   Additional details:  Prior to procedure, discussed risks of blister formation, small wound, skin dyspigmentation, or rare scar following cryotherapy. Recommend Vaseline ointment to treated areas while healing.    Return in about 1 year (around 10/05/2023) for TBSE, with Dr.  Roseanne Reno.  Anise Salvo, RMA, am acting as scribe for Willeen Niece, MD .   Documentation: I have reviewed the above documentation for accuracy and completeness, and I agree with the above.  Willeen Niece, MD

## 2023-10-06 ENCOUNTER — Other Ambulatory Visit: Payer: Self-pay | Admitting: Family Medicine

## 2023-10-06 DIAGNOSIS — Z1231 Encounter for screening mammogram for malignant neoplasm of breast: Secondary | ICD-10-CM

## 2023-10-28 ENCOUNTER — Ambulatory Visit
Admission: RE | Admit: 2023-10-28 | Discharge: 2023-10-28 | Disposition: A | Discharge status: Home / Self Care | Source: Ambulatory Visit | Attending: Family Medicine | Admitting: Family Medicine

## 2023-10-28 DIAGNOSIS — Z1231 Encounter for screening mammogram for malignant neoplasm of breast: Secondary | ICD-10-CM | POA: Insufficient documentation

## 2024-01-17 ENCOUNTER — Ambulatory Visit: Payer: Medicare HMO | Admitting: Dermatology

## 2024-01-17 DIAGNOSIS — W908XXA Exposure to other nonionizing radiation, initial encounter: Secondary | ICD-10-CM

## 2024-01-17 DIAGNOSIS — L578 Other skin changes due to chronic exposure to nonionizing radiation: Secondary | ICD-10-CM | POA: Diagnosis not present

## 2024-01-17 DIAGNOSIS — D1801 Hemangioma of skin and subcutaneous tissue: Secondary | ICD-10-CM

## 2024-01-17 DIAGNOSIS — D2272 Melanocytic nevi of left lower limb, including hip: Secondary | ICD-10-CM | POA: Diagnosis not present

## 2024-01-17 DIAGNOSIS — Z1283 Encounter for screening for malignant neoplasm of skin: Secondary | ICD-10-CM | POA: Diagnosis not present

## 2024-01-17 DIAGNOSIS — D225 Melanocytic nevi of trunk: Secondary | ICD-10-CM

## 2024-01-17 DIAGNOSIS — L820 Inflamed seborrheic keratosis: Secondary | ICD-10-CM

## 2024-01-17 DIAGNOSIS — L814 Other melanin hyperpigmentation: Secondary | ICD-10-CM | POA: Diagnosis not present

## 2024-01-17 DIAGNOSIS — L720 Epidermal cyst: Secondary | ICD-10-CM

## 2024-01-17 DIAGNOSIS — D229 Melanocytic nevi, unspecified: Secondary | ICD-10-CM

## 2024-01-17 DIAGNOSIS — I781 Nevus, non-neoplastic: Secondary | ICD-10-CM

## 2024-01-17 DIAGNOSIS — L821 Other seborrheic keratosis: Secondary | ICD-10-CM

## 2024-01-17 DIAGNOSIS — L304 Erythema intertrigo: Secondary | ICD-10-CM | POA: Diagnosis not present

## 2024-01-17 MED ORDER — KETOCONAZOLE 2 % EX CREA: TOPICAL_CREAM | CUTANEOUS | 5 refills | Status: AC

## 2024-01-17 NOTE — Progress Notes (Signed)
 "  Follow-Up Visit   Subjective  Wendy Decker is a 84 y.o. female who presents for the following: Skin Cancer Screening and Full Body Skin Exam  The patient presents for Total-Body Skin Exam (TBSE) for skin cancer screening and mole check. The patient has spots, moles and lesions to be evaluated, some may be new or changing, including several spots on the face and neck.     The following portions of the chart were reviewed this encounter and updated as appropriate: medications, allergies, medical history  Review of Systems:  No other skin or systemic complaints except as noted in HPI or Assessment and Plan.  Objective  Well appearing patient in no apparent distress; mood and affect are within normal limits.  A full examination was performed including scalp, head, eyes, ears, nose, lips, neck, chest, axillae, abdomen, back, buttocks, bilateral upper extremities, bilateral lower extremities, hands, feet, fingers, toes, fingernails, and toenails. All findings within normal limits unless otherwise noted below.   Relevant physical exam findings are noted in the Assessment and Plan.  L wrist Erythematous stuck-on, waxy papule  Assessment & Plan   SKIN CANCER SCREENING PERFORMED TODAY.  ACTINIC DAMAGE - Chronic condition, secondary to cumulative UV/sun exposure - diffuse scaly erythematous macules with underlying dyspigmentation - Recommend daily broad spectrum sunscreen SPF 30+ to sun-exposed areas, reapply every 2 hours as needed.  - Staying in the shade or wearing long sleeves, sun glasses (UVA+UVB protection) and wide brim hats (4-inch brim around the entire circumference of the hat) are also recommended for sun protection.  - Call for new or changing lesions.  LENTIGINES, SEBORRHEIC KERATOSES (including R neck), HEMANGIOMAS - Benign normal skin lesions - Benign-appearing - Call for any changes - SK vs Lentigo at upper sternum, 5 x 4mm waxy tan macule, lighter center. Stable.  Observation.   MELANOCYTIC NEVI - Tan-brown and/or pink-flesh-colored symmetric macules and papules - L lateral thigh, 1 mm medium dark brown macule, stable from previous visit  - L breast 5 x 3 mm med brown macule, pt states present for years no changes, stable from previous visit. Benign features under dermoscopy. - Benign appearing on exam today - Observation - Call clinic for new or changing moles - Recommend daily use of broad spectrum spf 30+ sunscreen to sun-exposed areas.   TELANGIECTASIA Exam: dilated blood vessel(s)  Treatment Plan: Benign appearing on exam Call for changes  Milia - tiny firm white papules, R lower nasal dorsum - type of cyst - benign - sometimes these will clear with nightly OTC adapalene/Differin 0.1% gel or retinol. 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 Condition is symptomatic/ bothersome to patient. Not currently at goal.   Intertrigo is a chronic recurrent rash that occurs in skin fold areas that may be associated with friction; heat; moisture; yeast; fungus; and bacteria.  It is exacerbated by increased movement / activity; sweating; and higher atmospheric temperature.  Use of an absorbant powder such as Zeasorb AF powder or other OTC antifungal powder to the area daily can prevent rash recurrence. Other options to help keep the area dry include blow drying the area after bathing or using antiperspirant products such as Duradry sweat minimizing gel.  Treatment Plan: Start ketoconazole  2% cream once to twice daily to aa rash on chest  INFLAMED SEBORRHEIC KERATOSIS L wrist Symptomatic, irritating, patient would like  treated. - Destruction of lesion - L wrist  Destruction method: cryotherapy   Informed consent: discussed and consent obtained   Lesion destroyed using liquid  nitrogen: Yes   Region frozen until ice ball extended beyond lesion: Yes   Outcome: patient tolerated procedure well with no complications   Post-procedure details: wound care instructions given   Additional details:  Prior to procedure, discussed risks of blister formation, small wound, skin dyspigmentation, or rare scar following cryotherapy. Recommend Vaseline ointment to treated areas while healing.   Return 1-2 yrs, for TBSE. Recheck nevus left breast.  I, Andrea Kerns, CMA, am acting as scribe for Rexene Rattler, MD .   Documentation: I have reviewed the above documentation for accuracy and completeness, and I agree with the above.  Rexene Rattler, MD    "

## 2024-01-17 NOTE — Patient Instructions (Addendum)
 Cryotherapy Aftercare  Wash gently with soap and water everyday.   Apply Vaseline and Band-Aid daily until healed.   Recommend starting moisturizer with exfoliant (Urea, Salicylic acid, or Lactic acid) one to two times daily to help smooth rough and bumpy skin.  OTC options include Cetaphil Rough and Bumpy lotion (Urea), Eucerin Roughness Relief lotion or spot treatment cream (Urea), CeraVe SA lotion/cream for Rough and Bumpy skin (Sal Acid), Gold Bond Rough and Bumpy cream (Sal Acid), and AmLactin 12% lotion/cream (Lactic Acid).  If applying in morning, also apply sunscreen to sun-exposed areas, since these exfoliating moisturizers can increase sensitivity to sun.   Seborrheic Keratosis  What causes seborrheic keratoses? Seborrheic keratoses are harmless, common skin growths that first appear during adult life.  As time goes by, more growths appear.  Some people may develop a large number of them.  Seborrheic keratoses appear on both covered and uncovered body parts.  They are not caused by sunlight.  The tendency to develop seborrheic keratoses can be inherited.  They vary in color from skin-colored to gray, brown, or even black.  They can be either smooth or have a rough, warty surface.   Seborrheic keratoses are superficial and look as if they were stuck on the skin.  Under the microscope this type of keratosis looks like layers upon layers of skin.  That is why at times the top layer may seem to fall off, but the rest of the growth remains and re-grows.    Treatment Seborrheic keratoses do not need to be treated, but can easily be removed in the office.  Seborrheic keratoses often cause symptoms when they rub on clothing or jewelry.  Lesions can be in the way of shaving.  If they become inflamed, they can cause itching, soreness, or burning.  Removal of a seborrheic keratosis can be accomplished by freezing, burning, or surgery. If any spot bleeds, scabs, or grows rapidly, please return to have  it checked, as these can be an indication of a skin cancer.  Due to recent changes in healthcare laws, you may see results of your pathology and/or laboratory studies on MyChart before the doctors have had a chance to review them. We understand that in some cases there may be results that are confusing or concerning to you. Please understand that not all results are received at the same time and often the doctors may need to interpret multiple results in order to provide you with the best plan of care or course of treatment. Therefore, we ask that you please give us  2 business days to thoroughly review all your results before contacting the office for clarification. Should we see a critical lab result, you will be contacted sooner.   If You Need Anything After Your Visit  If you have any questions or concerns for your doctor, please call our main line at [phone] and press option 4 to reach your doctor's medical assistant. If no one answers, please leave a voicemail as directed and we will return your call as soon as possible. Messages left after 4 pm will be answered the following business day.   You may also send us  a message via MyChart. We typically respond to MyChart messages within 1-2 business days.  For prescription refills, please ask your pharmacy to contact our office. Our fax number is [phone].  If you have an urgent issue when the clinic is closed that cannot wait until the next business day, you can page your doctor at the  number below.    Please note that while we do our best to be available for urgent issues outside of office hours, we are not available 24/7.   If you have an urgent issue and are unable to reach us , you may choose to seek medical care at your doctor's office, retail clinic, urgent care center, or emergency room.  If you have a medical emergency, please immediately call 911 or go to the emergency department.  Pager Numbers  - Dr. Hester:  [phone]  - Dr. Jackquline: [phone]  - Dr. Claudene: [phone]   - Dr. Raymund: [phone]  In the event of inclement weather, please call our main line at [phone] for an update on the status of any delays or closures.  Dermatology Medication Tips: Please keep the boxes that topical medications come in in order to help keep track of the instructions about where and how to use these. Pharmacies typically print the medication instructions only on the boxes and not directly on the medication tubes.   If your medication is too expensive, please contact our office at [phone] option 4 or send us  a message through MyChart.   We are unable to tell what your co-pay for medications will be in advance as this is different depending on your insurance coverage. However, we may be able to find a substitute medication at lower cost or fill out paperwork to get insurance to cover a needed medication.   If a prior authorization is required to get your medication covered by your insurance company, please allow us  1-2 business days to complete this process.  Drug prices often vary depending on where the prescription is filled and some pharmacies may offer cheaper prices.  The website www.goodrx.com contains coupons for medications through different pharmacies. The prices here do not account for what the cost may be with help from insurance (it may be cheaper with your insurance), but the website can give you the price if you did not use any insurance.  - You can print the associated coupon and take it with your prescription to the pharmacy.  - You may also stop by our office during regular business hours and pick up a GoodRx coupon card.  - If you need your prescription sent electronically to a different pharmacy, notify our office through [facility] or by phone at [phone] option 4.     Si Usted Necesita Algo Despus de Su Visita  Tambin puede enviarnos un mensaje a travs  de Clinical Cytogeneticist. Por lo general respondemos a los mensajes de MyChart en el transcurso de 1 a 2 das hbiles.  Para renovar recetas, por favor pida a su farmacia que se ponga en contacto con nuestra oficina. Randi lakes de fax es [place] [phone].  Si tiene un asunto urgente cuando la clnica est cerrada y que no puede esperar hasta el siguiente da hbil, puede llamar/localizar a su doctor(a) al nmero que aparece a continuacin.   Por favor, tenga en cuenta que aunque hacemos todo lo posible para estar disponibles para asuntos urgentes fuera del horario de [place], no estamos disponibles las 24 horas del da, los 7 [address]  [address] de la [place].   Si tiene un problema urgente y no puede comunicarse con nosotros, puede optar por buscar atencin mdica  en el consultorio de su doctor(a), en una clnica privada, en un centro de atencin urgente o en una sala de emergencias.  Si tiene engineer, drilling, por favor llame inmediatamente al 911 o vaya a la  sala de emergencias.  Nmeros de bper  - Dr. Hester: [phone]  - Dra. Jackquline: [phone]  - Dr. Claudene: [phone]  - Dra. Kitts: [phone]  En caso de inclemencias del [place], por favor llame a nuestra lnea principal al [phone] para una actualizacin sobre el estado de cualquier retraso o cierre.  Consejos para la medicacin en dermatologa: Por favor, guarde las cajas en las que vienen los medicamentos de uso tpico para ayudarle a seguir las instrucciones sobre dnde y cmo usarlos. Las farmacias generalmente imprimen las instrucciones del medicamento slo en las cajas y no directamente en los tubos del [place].   Si su medicamento es muy caro, por favor, pngase en contacto con landry rieger llamando al [phone] y presione la opcin 4 o envenos un mensaje a travs de Clinical Cytogeneticist.   No podemos decirle cul ser su copago por los medicamentos por adelantado ya que esto es diferente dependiendo de la cobertura de su seguro.  Sin embargo, es posible que podamos encontrar un medicamento sustituto a audiological scientist un formulario para que el seguro cubra el medicamento que se considera necesario.   Si se requiere una autorizacin previa para que su compaa de seguros cubra su medicamento, por favor permtanos de 1 a 2 das hbiles para completar este proceso.  Los precios de los medicamentos varan con frecuencia dependiendo del environmental consultant de dnde se surte la receta y alguna farmacias pueden ofrecer precios ms baratos.  El sitio web www.goodrx.com tiene cupones para medicamentos de health and safety inspector. Los precios aqu no tienen en cuenta lo que podra costar con la ayuda del seguro (puede ser ms barato con su seguro), pero el sitio web puede darle el precio si no utiliz tourist information centre manager.  - Puede imprimir el cupn correspondiente y llevarlo con su receta a la farmacia.  - Tambin puede pasar por nuestra oficina durante el horario de atencin regular y education officer, museum una tarjeta de cupones de GoodRx.  - Si necesita que su receta se enve electrnicamente a una farmacia diferente, informe a nuestra oficina a travs de MyChart de [place] o por telfono llamando al [phone] y presione la opcin 4.

## 2025-01-22 ENCOUNTER — Encounter: Admitting: Dermatology
